# Patient Record
Sex: Female | Born: 1960 | Race: White | Hispanic: No | Marital: Married | Smoking: Former smoker
Health system: Southern US, Community
[De-identification: ages and names within clinical notes are randomized; demographics above are authoritative.]

## PROBLEM LIST (undated history)

## (undated) DIAGNOSIS — J302 Other seasonal allergic rhinitis: Secondary | ICD-10-CM

## (undated) DIAGNOSIS — R350 Frequency of micturition: Secondary | ICD-10-CM

## (undated) DIAGNOSIS — C801 Malignant (primary) neoplasm, unspecified: Secondary | ICD-10-CM

## (undated) DIAGNOSIS — N201 Calculus of ureter: Secondary | ICD-10-CM

## (undated) DIAGNOSIS — S92912A Unspecified fracture of left toe(s), initial encounter for closed fracture: Secondary | ICD-10-CM

## (undated) DIAGNOSIS — Z87442 Personal history of urinary calculi: Secondary | ICD-10-CM

## (undated) DIAGNOSIS — N393 Stress incontinence (female) (male): Secondary | ICD-10-CM

## (undated) DIAGNOSIS — R3915 Urgency of urination: Secondary | ICD-10-CM

## (undated) DIAGNOSIS — T4145XA Adverse effect of unspecified anesthetic, initial encounter: Secondary | ICD-10-CM

## (undated) DIAGNOSIS — D509 Iron deficiency anemia, unspecified: Secondary | ICD-10-CM

## (undated) DIAGNOSIS — T8859XA Other complications of anesthesia, initial encounter: Secondary | ICD-10-CM

## (undated) HISTORY — DX: Malignant (primary) neoplasm, unspecified: C80.1

## (undated) HISTORY — PX: OTHER SURGICAL HISTORY: SHX169

## (undated) HISTORY — PX: ABDOMINAL HYSTERECTOMY: SHX81

## (undated) HISTORY — PX: COLONOSCOPY: SHX174

---

## 1980-10-29 HISTORY — PX: OTHER SURGICAL HISTORY: SHX169

## 1983-10-30 HISTORY — PX: OTHER SURGICAL HISTORY: SHX169

## 1998-07-15 ENCOUNTER — Ambulatory Visit (HOSPITAL_BASED_OUTPATIENT_CLINIC_OR_DEPARTMENT_OTHER): Admission: RE | Admit: 1998-07-15 | Discharge: 1998-07-15 | Payer: Self-pay | Admitting: Surgery

## 1999-02-20 ENCOUNTER — Emergency Department (HOSPITAL_COMMUNITY): Admission: EM | Admit: 1999-02-20 | Discharge: 1999-02-20 | Payer: Self-pay | Admitting: Emergency Medicine

## 1999-02-20 ENCOUNTER — Encounter: Payer: Self-pay | Admitting: Emergency Medicine

## 2001-10-29 HISTORY — PX: TUBAL LIGATION: SHX77

## 2001-10-29 HISTORY — PX: ECTOPIC PREGNANCY SURGERY: SHX613

## 2002-07-28 ENCOUNTER — Encounter: Payer: Self-pay | Admitting: Family Medicine

## 2002-07-28 ENCOUNTER — Encounter: Admission: RE | Admit: 2002-07-28 | Discharge: 2002-07-28 | Payer: Self-pay | Admitting: Family Medicine

## 2006-10-29 HISTORY — PX: OTHER SURGICAL HISTORY: SHX169

## 2007-10-30 HISTORY — PX: OTHER SURGICAL HISTORY: SHX169

## 2009-01-14 ENCOUNTER — Ambulatory Visit (HOSPITAL_COMMUNITY): Admission: RE | Admit: 2009-01-14 | Discharge: 2009-01-14 | Payer: Self-pay | Admitting: Obstetrics and Gynecology

## 2009-01-14 ENCOUNTER — Encounter (INDEPENDENT_AMBULATORY_CARE_PROVIDER_SITE_OTHER): Payer: Self-pay | Admitting: Obstetrics and Gynecology

## 2009-04-22 ENCOUNTER — Ambulatory Visit: Payer: Self-pay | Admitting: Internal Medicine

## 2009-05-03 LAB — CBC & DIFF AND RETIC
Basophils Absolute: 0.1 10*3/uL (ref 0.0–0.1)
EOS%: 2.3 % (ref 0.0–7.0)
Eosinophils Absolute: 0.2 10*3/uL (ref 0.0–0.5)
HGB: 10.1 g/dL — ABNORMAL LOW (ref 11.6–15.9)
Immature Retic Fract: 11.7 % — ABNORMAL HIGH (ref 0.00–10.70)
MCV: 67.6 fL — ABNORMAL LOW (ref 79.5–101.0)
MONO%: 6.8 % (ref 0.0–14.0)
NEUT#: 3.9 10*3/uL (ref 1.5–6.5)
RBC: 4.85 10*6/uL (ref 3.70–5.45)
RDW: 15.8 % — ABNORMAL HIGH (ref 11.2–14.5)
Retic %: 1.03 % (ref 0.50–1.50)
Retic Ct Abs: 49.96 10*3/uL (ref 18.30–72.70)
WBC: 6.6 10*3/uL (ref 3.9–10.3)
lymph#: 2 10*3/uL (ref 0.9–3.3)

## 2009-05-05 LAB — COMPREHENSIVE METABOLIC PANEL
ALT: 13 U/L (ref 0–35)
Albumin: 4 g/dL (ref 3.5–5.2)
CO2: 19 mEq/L (ref 19–32)
Calcium: 8.8 mg/dL (ref 8.4–10.5)
Chloride: 107 mEq/L (ref 96–112)
Glucose, Bld: 115 mg/dL — ABNORMAL HIGH (ref 70–99)
Sodium: 137 mEq/L (ref 135–145)
Total Protein: 6.7 g/dL (ref 6.0–8.3)

## 2009-05-05 LAB — LACTATE DEHYDROGENASE: LDH: 146 U/L (ref 94–250)

## 2009-05-05 LAB — PROTEIN ELECTROPHORESIS, SERUM
Albumin ELP: 58.6 % (ref 55.8–66.1)
Alpha-1-Globulin: 4.1 % (ref 2.9–4.9)
Alpha-2-Globulin: 9.6 % (ref 7.1–11.8)

## 2009-05-05 LAB — FOLATE: Folate: 15.6 ng/mL

## 2009-05-05 LAB — VITAMIN B12: Vitamin B-12: 230 pg/mL (ref 211–911)

## 2009-05-05 LAB — IRON AND TIBC: UIBC: 408 ug/dL

## 2009-06-29 ENCOUNTER — Ambulatory Visit: Payer: Self-pay | Admitting: Internal Medicine

## 2009-07-01 LAB — CBC WITH DIFFERENTIAL/PLATELET
BASO%: 0.6 % (ref 0.0–2.0)
Basophils Absolute: 0 10*3/uL (ref 0.0–0.1)
Eosinophils Absolute: 0.1 10*3/uL (ref 0.0–0.5)
HCT: 37.6 % (ref 34.8–46.6)
HGB: 12.4 g/dL (ref 11.6–15.9)
LYMPH%: 38.5 % (ref 14.0–49.7)
MCHC: 32.9 g/dL (ref 31.5–36.0)
MONO#: 0.6 10*3/uL (ref 0.1–0.9)
NEUT#: 2.7 10*3/uL (ref 1.5–6.5)
NEUT%: 47.5 % (ref 38.4–76.8)
Platelets: 321 10*3/uL (ref 145–400)
WBC: 5.7 10*3/uL (ref 3.9–10.3)
lymph#: 2.2 10*3/uL (ref 0.9–3.3)

## 2009-07-01 LAB — IRON AND TIBC
%SAT: 27 % (ref 20–55)
TIBC: 388 ug/dL (ref 250–470)

## 2009-07-01 LAB — FERRITIN: Ferritin: 6 ng/mL — ABNORMAL LOW (ref 10–291)

## 2009-10-10 ENCOUNTER — Ambulatory Visit: Payer: Self-pay | Admitting: Internal Medicine

## 2009-10-12 LAB — CBC WITH DIFFERENTIAL/PLATELET
Basophils Absolute: 0.1 10*3/uL (ref 0.0–0.1)
EOS%: 1.5 % (ref 0.0–7.0)
Eosinophils Absolute: 0.1 10*3/uL (ref 0.0–0.5)
HGB: 14.9 g/dL (ref 11.6–15.9)
LYMPH%: 32.6 % (ref 14.0–49.7)
MCH: 28.1 pg (ref 25.1–34.0)
MCV: 83.2 fL (ref 79.5–101.0)
MONO%: 9.2 % (ref 0.0–14.0)
Platelets: 335 10*3/uL (ref 145–400)
RDW: 15.8 % — ABNORMAL HIGH (ref 11.2–14.5)

## 2009-10-12 LAB — IRON AND TIBC: UIBC: 238 ug/dL

## 2009-10-12 LAB — FERRITIN: Ferritin: 26 ng/mL (ref 10–291)

## 2010-06-05 ENCOUNTER — Ambulatory Visit: Payer: Self-pay | Admitting: Sports Medicine

## 2010-06-05 DIAGNOSIS — M79609 Pain in unspecified limb: Secondary | ICD-10-CM

## 2010-06-05 DIAGNOSIS — R269 Unspecified abnormalities of gait and mobility: Secondary | ICD-10-CM

## 2010-06-05 DIAGNOSIS — M19049 Primary osteoarthritis, unspecified hand: Secondary | ICD-10-CM | POA: Insufficient documentation

## 2010-11-30 NOTE — Assessment & Plan Note (Signed)
Summary: NP [facility]NER W/FOOT PAIN/MJD   Vital Signs:  Patient profile:   50 year old female Height:      70 inches Weight:      200 pounds BMI:     28.80 BP sitting:   127 / 82  Vitals Entered By: Lillia Pauls CMA (June 05, 2010 8:40 AM)  History of Present Illness: Laura Wall is a 50 year old female who presents today with a 1 year history of pain at the right great toe MTP. She describes the pain as a sharp pain on the plantar surface of her foot that feels like she has a bruise under the ball of the foot. The pain does not radiate and is exacerbated by stepping on the ground barefoot or in flat shoes. She first had the pain a year ago, and experiences it for a couple of days every few weeks, without noting any association with changing physical activity. She has not noticed any redness or swelling of the joint and has not had similar episodes in any other joint.  Laura Wall also complains of pain at the site of a bone spur in the DIP of her right fifth finger. She describes some days in which she has a loss of range of motion and pain. She has seen a surgeon is considering surgical intervention when the pain and limitation becomes too great.  The patient is currenly beginning to train for a 5k, doing a mixture of running and walking for no more than 1.6 miles. She works in Her[employer] at [employer] and is not on her feet all day at work, but does do a lot of typing.   Allergies (verified): 1)  ! * Cdn 2)  ! Pcn  Past History:  Past Medical History: Osteoarthritis in both hands and right knee Bilateral lateral meniscal tears  Past Surgical History: Surgical removal of skin cancer from dorsal surface of right second toe 05/2007  Physical Exam  General:  Well-developed,well-nourished,in no acute distress; alert,appropriate and cooperative throughout examination Msk:  Feet: Symmetrical feet without transverse arch collapse. Mild tenderness to palpation at the head of the right  first metatarsal. No erythema or edema, non-tender to light touch. Plantar fascia and achilles insertions into calcaneous are non-tender to palpation.  Longitudinal archs in tact with some mild collapse (R>L) while standing.  Callous pattern shows eccentric callous on the right medial great toe and concentric callous on the left.  Full ROM of great toe. Pain elicited with plantarflexion of feet while standing.   Running and walking gait show reliance on great toe for push-off with very little supination.   Impression & Recommendations:  Problem # 1:  FOOT PAIN, RIGHT (ICD-729.5)  The patients pain is located at the right plantar surface of the MTP. Her callous pattern and gait suggest increased pressure on that joint, which is likely causing irritation and pain. Will try bilateral shoe inserts with some arch support on the right side to try to unload some pressure from that joint. If this intevention works well, the patient can return to have her other shoes fitted for arch support.  Orders: Sports Insoles [phone])  Problem # 2:  ABNORMALITY OF GAIT (ICD-781.2)  This patient's gait show's predominant push-off from the area of the great toe, which is likely contributing to her pain. The shoe inserts with arch support on the right side, should help to relieve some of the excess stress she applies to that region of the  foot.  Orders: Sports Insoles [phone])  Problem # 3:  OSTEOARTHRITIS, HANDS, BILATERAL (ICD-715.94) The patient has a heberden's node that is quite prominent at the right hand's fifth DIP. She needs to use her hands daily at work, but also is not likely to benefit greatly from surgery. We will try to help her symptoms by using a Ketoprophen gel, which should get good penetration into the joint capsule and will hopefully help her to maintain her hand function at work.  Complete Medication List: 1)  Topical Ketoprofen Gel 20%  .... Use two times a day to  qid Prescriptions: TOPICAL KETOPROFEN GEL 20% USE two times a day TO QID  #60GRM TUBE x PRN   Entered by:   Lillia Pauls CMA   Authorized by:   Enid Baas MD   Signed by:   Lillia Pauls CMA on 06/05/2010   Method used:   Print then Give to Patient   RxID:   [id number]

## 2011-02-08 LAB — BASIC METABOLIC PANEL
BUN: 6 mg/dL (ref 6–23)
CO2: 23 mEq/L (ref 19–32)
Calcium: 9.1 mg/dL (ref 8.4–10.5)
Chloride: 110 mEq/L (ref 96–112)
Creatinine, Ser: 0.87 mg/dL (ref 0.4–1.2)
GFR calc Af Amer: >60 mL/min (ref >60)
GFR calc non Af Amer: >60 mL/min (ref >60)
Glucose, Bld: 112 mg/dL — ABNORMAL HIGH (ref 70–99)
Potassium: 4.2 mEq/L (ref 3.5–5.1)
Sodium: 142 mEq/L (ref 135–145)

## 2011-02-08 LAB — CBC
HCT: 33.1 % — ABNORMAL LOW (ref 36.0–46.0)
Hemoglobin: 10.4 g/dL — ABNORMAL LOW (ref 12.0–15.0)
MCHC: 31.3 g/dL (ref 30.0–36.0)
MCV: 68.8 fL — ABNORMAL LOW (ref 78.0–100.0)
Platelets: 347 10*3/uL (ref 150–400)
RBC: 4.82 MIL/uL (ref 3.87–5.11)
RDW: 17.3 % — ABNORMAL HIGH (ref 11.5–15.5)
WBC: 5.7 10*3/uL (ref 4.0–10.5)

## 2011-02-08 LAB — PREGNANCY, URINE: Preg Test, Ur: NEGATIVE

## 2011-02-08 LAB — TYPE AND SCREEN
ABO/RH(D): O POS
Antibody Screen: NEGATIVE
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
## 2011-03-13 NOTE — Op Note (Signed)
NAME:  Laura Wall, Laura Wall                ACCOUNT NO.:  [id number]   MEDICAL RECORD NO.:  [id number]          PATIENT TYPE:  AMB   LOCATION:  DAY                          [facility]ACILITY:  [facility]   PHYSICIAN:  Crist Fat. Rivard, M.D. DATE OF BIRTH:  24-Sep-1961   DATE OF PROCEDURE:  01/14/2009  DATE OF DISCHARGE:                               OPERATIVE REPORT   PREOPERATIVE DIAGNOSES:  Chronic pelvic pain, uterine fibroids,  endometriosis, anemia, and post NovaSure syndrome.   POSTOPERATIVE DIAGNOSES:  Chronic pelvic pain, uterine fibroids,  endometriosis, anemia, and post NovaSure syndrome with left ovarian  cyst.   ANESTHESIA:  General Dr. Jean Rosenthal.   PROCEDURE:  Robotic-assisted total hysterectomy with left ovarian  cystectomy.   SURGEON:  Dr. Dois Davenport Rivard   ASSISTANT:  Elmira J. Zenia Resides BLOOD LOSS:  Minimal.   PROCEDURE:  After being informed of the planned procedure with possible  complications, including bleeding, infection, injury to bowels, bladder  or ureter, need for laparotomy, expected recovery time and postoperative  transient facial edema, informed consent was obtained.  The patient is  taken to OR #10, given general anesthesia with endotracheal intubation  without any complication.  She is placed in a lithotomy position on a  sticky mattress, arms padded and tucked on each side, knee-high  sequential compressive devices, chest taped to the table.   She is prepped and draped in a sterile fashion.  Pelvic exam reveals an  anteverted uterus with a dominant posterior fibroid for a total uterine  size of approximately 14 weeks.  The uterus was very mobile and both  adnexa are felt and felt to be normal.   A weighted speculum is inserted.  Anterior lip of the cervix is grasped  with a tenaculum forceps and the uterus is sounded at 8 cm.  The cervix  is easily dilated using Hegar dilator until #30, which allows easy  placement of an 8-mm intrauterine Rumi  manipulator.  Its balloon is  inflated with 5 mL of saline.  This is mounted with a vaginal occluder  and a 4.0 KOH ring.  The KOH ring is sutured to the cervix with simple  sutures of 0 Vicryl.  Weighted speculum is removed and a Foley catheter  is inserted into the bladder.   We measured 12 cm above the estimated fundus of the uterus which arrives  approximately 3 cm above the umbilicus.  That area is infiltrated with  Marcaine 0.25 and we perform a vertical 10-mm incision.  That incision  is brought down bluntly to the fascia and the fascia is grasped with  Kocher forceps.  The fascia is incised with Mayo scissors and peritoneum  is entered bluntly.   A pursestring suture of 0 Vicryl is placed on the fascia which will hold  in place a 10-mm Hasson trocar, easily inserted in the abdominal cavity.  This allows for easy insufflation of a pneumoperitoneum, using CO2 at a  maximum pressure of 15 mmHg.  A quick evaluation of the abdominopelvic  cavity reveals a uterus with absence of tubes, a normal right  ovary, a  left ovary with a 3.5 cm simple clear cyst.  The uterus has a dominant  left lower ureter and posterior fibroid, measuring approximately 6 cm.  Nevertheless, it is easily mobile and the posterior cul-de-sac is  normal.  We do note a little bit of retraction on the right pelvic wall,  compatible with previous endometriosis.  The appendix is visualized and  normal.  Liver is visualized and normal.  Gallbladder is not visualized.   Placement of trocars is then decided and we place two 8-mm robotic  trocars on the left after infiltrating with Marcaine 0.25, one 8-mm  trocar on the right after infiltrating with Marcaine 0.25 and a 10-mm  patient-side assistant trocar on the right after infiltrating with  Marcaine 0.25.  The robot is then side-docked and preparation and  docking is complete in 40 minutes.   In arm #1, a monopolar scissor is placed.  Arm #2 PK Gyrus forceps and  in  arm #3 a Cobra forceps.  Console time starts at 9 o'clock.   We proceed with the left side initially by cauterizing the utero-ovarian  ligament and sectioning it, cauterizing the round ligament and  sectioning it.  This gives [place] easy entry into the broad ligament and we  start with the anterior sheath which is brought down anteriorly over the  KOH ring, which is easily identified.  The bladder is bluntly dissected  away from the KOH ring, but this is done with some difficulty, possibly  from endometriotic adhesions.  To clearly identify the limits of the  bladder, we fill it with 200 mL of saline and this really helps [place]  dissect the bladder away from the anterior vaginal fornix bluntly and  sharply.  The posterior sheath of the left broad ligament is then  dissected down, keeping the left ureter under direct visualization.  The  left ureter is deep in the pelvis and clearly away from our site of  surgery.  Proceeding systematically we are able to skeletonize the  uterine artery on that side and we can cauterize it with PK Gyrus  forceps at the level of the KOH ring on its ascending branch.   We then move to the right side.  The utero-ovarian ligament is  cauterized with PK forceps in section.  The round ligament is cauterized  in section.  This gives [place] easy entry into the broad ligament and we  open the anterior sheath to meet the previous opening on the left side,  again all the way to the KOH ring.  We are able to easily dissect that  side of the bladder away from the anterior vaginal fornix, keeping the  KOH ring under direct visualization.  The right ureter is then  identified, easily kept under direct visualization, and we can now bring  down the posterior sheath of the broad ligament to completely  skeletonize the uterine vessels.  The uterine artery is cauterized at  its junction with the KOH ring at its ascending branch.  This is  performed with a  forceps.   Elevating the  uterus completely with the Cobra forceps, we insufflate  the vaginal occluder and, using an open monopolar scissors, we perform a  posterior colpotomy on the KOH ring.  This is brought anteriorly in a  360-degree fashion to completely free the uterus.  The uterus is  delivered vaginally.  Bleeding on the left vaginal artery is cauterized  with PK forceps rapidly.   The  vaginal occluder is reinserted in the vagina to maintain our  pneumoperitoneum and we put our attention on the left ovary.  Using hot  scissors, we open the ovarian cyst on a 1.5-cm distance and, using PK  forceps and Maryland forceps, we easily peel off the cyst wall from the  ovarian capsule.  We then cauterize to achieve a complete hemostasis.  We then profusely irrigate that cyst and still note a satisfactory  hemostasis.   Instruments are then modified for a suture cut in arm #1, a needle  holder in arm #2 and a PK Gyrus forceps in arm #3.  With the PK forceps,  we support the bladder anteriorly and we now proceed with closure of the  vaginal cuff, using figure-of-eight stitches of 0Vicryl.  We then  irrigate profusely with warm saline and complete hemostasis on the  bladder dissection using PK forceps.  We then again irrigate profusely  with warm saline and we note a satisfactory hemostasis.  The  pneumoperitoneum is removed and again hemostasis is satisfactory.   A full sheet of Intercede is deposited on the vaginal fornix.  All  instruments are then removed and the robot is undocked.  Console time  was 1 hour and 50 minutes.   The pneumoperitoneum is evacuated and all trocars are removed under  direct visualization.  The fascia of the supraumbilical incision is  closed with the previously placed pursestring suture of 0 Vicryl.  The  fascia of the right side 10-mm trocar is closed with a figure-of-eight  stitch of 0 Vicryl.  The skin of all incisions is closed with  subcuticular suture of 3-0 Monocryl and  Dermabond.  The vaginal occluder  is removed and a speculum is inserted to inspect the vaginal fornix,  which is hemostatic.  We do note a little bit of clear oozing from the  warm saline left in the pelvis.   Instrument and sponge count is complete x2.  Estimated blood loss is  minimal.  The procedure is very well tolerated by the patient who is  taken to recovery room in a well and stable condition.   Specimen composed of  uterus and left ovarian cyst wall was weighed at  214 grams and was sent to pathology.      Crist Fat Rivard, M.D.  Electronically Signed     SAR/MEDQ  D:  01/14/2009  T:  01/14/2009  Job:  [id number]

## 2011-03-16 ENCOUNTER — Inpatient Hospital Stay (INDEPENDENT_AMBULATORY_CARE_PROVIDER_SITE_OTHER)
Admission: RE | Admit: 2011-03-16 | Discharge: 2011-03-16 | Disposition: A | Discharge status: Home / Self Care | Payer: 59 | Source: Ambulatory Visit | Attending: Emergency Medicine | Admitting: Emergency Medicine

## 2011-03-16 ENCOUNTER — Ambulatory Visit (INDEPENDENT_AMBULATORY_CARE_PROVIDER_SITE_OTHER): Payer: 59

## 2011-03-16 DIAGNOSIS — S61209A Unspecified open wound of unspecified finger without damage to nail, initial encounter: Secondary | ICD-10-CM

## 2011-03-16 DIAGNOSIS — S6000XA Contusion of unspecified finger without damage to nail, initial encounter: Secondary | ICD-10-CM

## 2012-03-17 ENCOUNTER — Other Ambulatory Visit: Payer: Self-pay

## 2012-03-17 DIAGNOSIS — D219 Benign neoplasm of connective and other soft tissue, unspecified: Secondary | ICD-10-CM

## 2012-03-17 DIAGNOSIS — N83209 Unspecified ovarian cyst, unspecified side: Secondary | ICD-10-CM | POA: Insufficient documentation

## 2012-03-17 MED ORDER — FOLIVANE-PLUS PO CAPS: 1.0000 | ORAL_CAPSULE | Freq: Every day | ORAL | Status: DC

## 2012-06-25 ENCOUNTER — Telehealth: Payer: Self-pay

## 2012-06-25 NOTE — Telephone Encounter (Signed)
Pt states she needs refill of Folivane.  States SR has refilled this but only for two.  Will send msg to SR along with chart to office.  ld

## 2012-06-26 ENCOUNTER — Telehealth: Payer: Self-pay | Admitting: Obstetrics and Gynecology

## 2012-06-27 ENCOUNTER — Telehealth: Payer: Self-pay | Admitting: Obstetrics and Gynecology

## 2012-06-27 ENCOUNTER — Other Ambulatory Visit: Payer: Self-pay | Admitting: Obstetrics and Gynecology

## 2012-06-27 MED ORDER — INTEGRA PLUS PO CAPS: 1.0000 | ORAL_CAPSULE | Freq: Every day | ORAL | Status: DC

## 2012-06-27 NOTE — Telephone Encounter (Signed)
Pt wants Maylon Cos and states SR has given before.  Msg and chart sent to SR.  ld

## 2012-06-27 NOTE — Telephone Encounter (Signed)
TC from pt requesting Rf Folivane Plus.  Per Dr SR OK to RF Integra PLus.  Pt to be scheduled for annual. Rx Rf complete.

## 2012-06-27 NOTE — Telephone Encounter (Signed)
Laura/follow up on rx

## 2012-07-01 NOTE — Telephone Encounter (Signed)
This was reviewed with CMA on 06/27/12. Pt to call PCP. No documentation in the chart for previous prescription from this office.

## 2012-07-10 ENCOUNTER — Encounter: Payer: Self-pay | Admitting: Obstetrics and Gynecology

## 2012-07-10 ENCOUNTER — Telehealth: Payer: Self-pay | Admitting: Obstetrics and Gynecology

## 2012-07-10 ENCOUNTER — Ambulatory Visit (INDEPENDENT_AMBULATORY_CARE_PROVIDER_SITE_OTHER): Payer: 59 | Admitting: Obstetrics and Gynecology

## 2012-07-10 VITALS — BP 110/80 | HR 76 | Ht 70.0 in | Wt 184.0 lb

## 2012-07-10 DIAGNOSIS — Z01419 Encounter for gynecological examination (general) (routine) without abnormal findings: Secondary | ICD-10-CM

## 2012-07-10 DIAGNOSIS — R300 Dysuria: Secondary | ICD-10-CM

## 2012-07-10 DIAGNOSIS — D219 Benign neoplasm of connective and other soft tissue, unspecified: Secondary | ICD-10-CM | POA: Insufficient documentation

## 2012-07-10 DIAGNOSIS — R42 Dizziness and giddiness: Secondary | ICD-10-CM | POA: Insufficient documentation

## 2012-07-10 LAB — POCT URINALYSIS DIPSTICK
Blood, UA: NEGATIVE
Nitrite, UA: NEGATIVE
Protein, UA: NEGATIVE
Urobilinogen, UA: NEGATIVE
pH, UA: 5

## 2012-07-10 MED ORDER — NYSTATIN-TRIAMCINOLONE 100000-0.1 UNIT/GM-% EX OINT: TOPICAL_OINTMENT | Freq: Three times a day (TID) | CUTANEOUS | Status: DC | PRN

## 2012-07-10 NOTE — Progress Notes (Signed)
The patient is not taking hormone replacement therapy The patient  is taking a Calcium supplement. Post-menopausal bleeding:no  Last Pap: was normal March  2011 Last mammogram: was normal November  2012 Last DEXA scan : n/a Last colonoscopy:normal, benign polyps removed October 2008  Urinary symptoms: burning Normal bowel movements: Yes Reports abuse at home: No:   Subjective:    Laura Wall is a 51 y.o. female G2P1 who presents for annual exam. S/p robotic hysterectomy 2010 The patient has no complaints today.   The following portions of the patient's history were reviewed and updated as appropriate: allergies, current medications, past family history, past medical history, past social history, past surgical history and problem list.  Review of Systems Pertinent items are noted in HPI. Gastrointestinal:No change in bowel habits, no abdominal pain, no rectal bleeding Genitourinary:negative for dysuria, frequency, hematuria, nocturia and urinary incontinence Occasional burning with urination   Objective:     BP 110/80  Pulse 76  Ht 5\' 10"  (1.778 m)  Wt 184 lb (83.462 kg)  BMI 26.40 kg/m2  Weight:  Wt Readings from Last 1 Encounters:  07/10/12 184 lb (83.462 kg)     BMI: Body mass index is 26.40 kg/(m^2). General Appearance: Alert, appropriate appearance for age. No acute distress HEENT: Grossly normal Neck / Thyroid: Supple, no masses, nodes or enlargement Lungs: clear to auscultation bilaterally Back: No CVA tenderness Breast Exam: No masses or nodes.No dimpling, nipple retraction or discharge. Cardiovascular: Regular rate and rhythm. S1, S2, no murmur Gastrointestinal: Soft, non-tender, no masses or organomegaly Pelvic Exam: Mild erythema on vulva.Vagina normal. Ovaries normal. uterus absent Rectovaginal: normal rectal, no masses Lymphatic Exam: Non-palpable nodes in neck, clavicular, axillary, or inguinal regions Skin: no rash or abnormalities Neurologic: Normal  gait and speech, no tremor  Psychiatric: Alert and oriented, appropriate affect.   U/A: negative   Assessment:    Normal gyn exam  Vulvar non-specific vulvar dermatitis   Plan:   mammogram return annually or prn Follow-up:  for annual exam Mycolog II

## 2012-07-10 NOTE — Telephone Encounter (Signed)
LAURA/PHARM CLAR.

## 2012-07-11 ENCOUNTER — Telehealth: Payer: Self-pay

## 2012-07-11 NOTE — Telephone Encounter (Signed)
PC from [facility] pharmacy. Mycolog rx was going to be $100 copay.  Pharmacist asked if they could split meds per pt request for $4 copay each.  Pt will combine.  Gave permission to do so.  ld

## 2012-07-28 ENCOUNTER — Telehealth: Payer: Self-pay | Admitting: Obstetrics and Gynecology

## 2012-07-28 NOTE — Telephone Encounter (Signed)
Fax refill request sent in [place] with 11 refills.  Pt notified.  ld

## 2012-07-28 NOTE — Telephone Encounter (Signed)
VM from pt. Requesting RF for Integra.  [facility].   Pt [phone]

## 2012-07-28 NOTE — Telephone Encounter (Signed)
sr pt 

## 2012-08-14 ENCOUNTER — Other Ambulatory Visit: Payer: Self-pay | Admitting: Urology

## 2012-08-26 ENCOUNTER — Encounter (HOSPITAL_BASED_OUTPATIENT_CLINIC_OR_DEPARTMENT_OTHER): Payer: Self-pay | Admitting: *Deleted

## 2012-08-26 NOTE — Progress Notes (Addendum)
NPO AFTER MN. ARRIVES AT 1030. NEEDS HG.  THE PT STATES THE STONE IS EMBEDDED IN THE URETHRAL SLING, THAT'S WHY THERE IS NO LEFT OR RIGHT ON PERMIT.

## 2012-08-29 ENCOUNTER — Encounter (HOSPITAL_BASED_OUTPATIENT_CLINIC_OR_DEPARTMENT_OTHER): Payer: Self-pay | Admitting: Anesthesiology

## 2012-08-29 ENCOUNTER — Ambulatory Visit (HOSPITAL_BASED_OUTPATIENT_CLINIC_OR_DEPARTMENT_OTHER): Payer: 59 | Admitting: Anesthesiology

## 2012-08-29 ENCOUNTER — Ambulatory Visit (HOSPITAL_BASED_OUTPATIENT_CLINIC_OR_DEPARTMENT_OTHER)
Admission: RE | Admit: 2012-08-29 | Discharge: 2012-08-29 | Disposition: A | Discharge status: Home / Self Care | Payer: 59 | Source: Ambulatory Visit | Attending: Urology | Admitting: Urology

## 2012-08-29 ENCOUNTER — Encounter (HOSPITAL_BASED_OUTPATIENT_CLINIC_OR_DEPARTMENT_OTHER): Payer: Self-pay | Admitting: *Deleted

## 2012-08-29 ENCOUNTER — Encounter (HOSPITAL_BASED_OUTPATIENT_CLINIC_OR_DEPARTMENT_OTHER)
Admission: RE | Disposition: A | Discharge status: Home / Self Care | Payer: Self-pay | Source: Ambulatory Visit | Attending: Urology

## 2012-08-29 DIAGNOSIS — N211 Calculus in urethra: Secondary | ICD-10-CM | POA: Insufficient documentation

## 2012-08-29 DIAGNOSIS — Z90710 Acquired absence of both cervix and uterus: Secondary | ICD-10-CM | POA: Insufficient documentation

## 2012-08-29 DIAGNOSIS — Y831 Surgical operation with implant of artificial internal device as the cause of abnormal reaction of the patient, or of later complication, without mention of misadventure at the time of the procedure: Secondary | ICD-10-CM | POA: Insufficient documentation

## 2012-08-29 DIAGNOSIS — T8389XA Other specified complication of genitourinary prosthetic devices, implants and grafts, initial encounter: Secondary | ICD-10-CM | POA: Insufficient documentation

## 2012-08-29 HISTORY — PX: HOLMIUM LASER APPLICATION: SHX5852

## 2012-08-29 HISTORY — DX: Personal history of urinary calculi: Z87.442

## 2012-08-29 HISTORY — DX: Calculus of ureter: N20.1

## 2012-08-29 HISTORY — DX: Other seasonal allergic rhinitis: J30.2

## 2012-08-29 HISTORY — DX: Urgency of urination: R39.15

## 2012-08-29 HISTORY — DX: Stress incontinence (female) (male): N39.3

## 2012-08-29 HISTORY — DX: Iron deficiency anemia, unspecified: D50.9

## 2012-08-29 HISTORY — PX: CYSTOSCOPY: SHX5120

## 2012-08-29 HISTORY — DX: Frequency of micturition: R35.0

## 2012-08-29 SURGERY — CYSTOSCOPY
Anesthesia: General | Site: Urethra | Wound class: Clean Contaminated

## 2012-08-29 MED ORDER — PROMETHAZINE HCL 25 MG/ML IJ SOLN
6.2500 mg | INTRAMUSCULAR | Status: DC | PRN
  Filled 2012-08-29: qty 1

## 2012-08-29 MED ORDER — ACETAMINOPHEN 10 MG/ML IV SOLN
INTRAVENOUS | Status: DC | PRN
  Administered 2012-08-29: 1000 mg via INTRAVENOUS

## 2012-08-29 MED ORDER — LACTATED RINGERS IV SOLN
INTRAVENOUS | Status: DC
  Administered 2012-08-29: 11:00:00 via INTRAVENOUS
  Filled 2012-08-29: qty 1000

## 2012-08-29 MED ORDER — PROPOFOL 10 MG/ML IV BOLUS
INTRAVENOUS | Status: DC | PRN
  Administered 2012-08-29: 200 mg via INTRAVENOUS

## 2012-08-29 MED ORDER — FENTANYL CITRATE 0.05 MG/ML IJ SOLN
25.0000 ug | INTRAMUSCULAR | Status: DC | PRN
  Filled 2012-08-29: qty 1

## 2012-08-29 MED ORDER — IOHEXOL 350 MG/ML SOLN
INTRAVENOUS | Status: DC | PRN
  Administered 2012-08-29: 1 mL

## 2012-08-29 MED ORDER — DEXAMETHASONE SODIUM PHOSPHATE 4 MG/ML IJ SOLN
INTRAMUSCULAR | Status: DC | PRN
  Administered 2012-08-29: 10 mg via INTRAVENOUS

## 2012-08-29 MED ORDER — KETOROLAC TROMETHAMINE 30 MG/ML IJ SOLN
15.0000 mg | Freq: Once | INTRAMUSCULAR | Status: DC | PRN
  Filled 2012-08-29: qty 1

## 2012-08-29 MED ORDER — LIDOCAINE HCL (CARDIAC) 20 MG/ML IV SOLN
INTRAVENOUS | Status: DC | PRN
  Administered 2012-08-29: 80 mg via INTRAVENOUS

## 2012-08-29 MED ORDER — TRAMADOL HCL 50 MG PO TABS
50.0000 mg | ORAL_TABLET | Freq: Four times a day (QID) | ORAL | Status: DC | PRN
  Administered 2012-08-29: 50 mg via ORAL
  Filled 2012-08-29: qty 1

## 2012-08-29 MED ORDER — MIDAZOLAM HCL 5 MG/5ML IJ SOLN
INTRAMUSCULAR | Status: DC | PRN
  Administered 2012-08-29: 2 mg via INTRAVENOUS

## 2012-08-29 MED ORDER — SODIUM CHLORIDE 0.9 % IR SOLN
Status: DC | PRN
  Administered 2012-08-29: 6000 mL

## 2012-08-29 MED ORDER — TRAMADOL HCL 50 MG PO TABS: 50.0000 mg | ORAL_TABLET | Freq: Four times a day (QID) | ORAL | Status: DC | PRN

## 2012-08-29 MED ORDER — ONDANSETRON HCL 4 MG/2ML IJ SOLN
INTRAMUSCULAR | Status: DC | PRN
  Administered 2012-08-29: 4 mg via INTRAVENOUS

## 2012-08-29 MED ORDER — FENTANYL CITRATE 0.05 MG/ML IJ SOLN
INTRAMUSCULAR | Status: DC | PRN
  Administered 2012-08-29 (×2): 50 ug via INTRAVENOUS

## 2012-08-29 MED ORDER — KETOROLAC TROMETHAMINE 30 MG/ML IJ SOLN
INTRAMUSCULAR | Status: DC | PRN
  Administered 2012-08-29: 30 mg via INTRAVENOUS

## 2012-08-29 MED ORDER — GENTAMICIN IN SALINE 1.6-0.9 MG/ML-% IV SOLN
80.0000 mg | INTRAVENOUS | Status: AC
  Administered 2012-08-29: 80 mg via INTRAVENOUS
  Filled 2012-08-29: qty 50

## 2012-08-29 MED ORDER — STERILE WATER FOR IRRIGATION IR SOLN
Status: DC | PRN
  Administered 2012-08-29: 500 mL

## 2012-08-29 MED ORDER — SENNA-DOCUSATE SODIUM 8.6-50 MG PO TABS: 1.0000 | ORAL_TABLET | Freq: Two times a day (BID) | ORAL | Status: DC

## 2012-08-29 SURGICAL SUPPLY — 37 items
ADAPTER CATH URET PLST 4-6FR (CATHETERS) ×3 IMPLANT
BAG DRAIN URO-CYSTO SKYTR STRL (DRAIN) ×3 IMPLANT
BAG URO CATCHER STRL LF (DRAPE) ×3 IMPLANT
BASKET LASER NITINOL 1.9FR (BASKET) IMPLANT
BASKET STNLS GEMINI 4WIRE 3FR (BASKET) IMPLANT
BASKET ZERO TIP NITINOL 2.4FR (BASKET) IMPLANT
BRUSH URET BIOPSY 3F (UROLOGICAL SUPPLIES) IMPLANT
CANISTER SUCT LVC 12 LTR MEDI- (MISCELLANEOUS) ×3 IMPLANT
CATH INTERMIT  6FR 70CM (CATHETERS) ×3 IMPLANT
CATH URET 5FR 28IN CONE TIP (BALLOONS)
CATH URET 5FR 28IN OPEN ENDED (CATHETERS) IMPLANT
CATH URET 5FR 70CM CONE TIP (BALLOONS) IMPLANT
CLOTH BEACON ORANGE TIMEOUT ST (SAFETY) ×3 IMPLANT
DRAPE CAMERA CLOSED 9X96 (DRAPES) ×3 IMPLANT
ELECT REM PT RETURN 9FT ADLT (ELECTROSURGICAL)
ELECTRODE REM PT RTRN 9FT ADLT (ELECTROSURGICAL) IMPLANT
GLOVE BIO SURGEON STRL SZ7.5 (GLOVE) ×3 IMPLANT
GLOVE BIOGEL PI IND STRL 7.0 (GLOVE) ×6 IMPLANT
GLOVE BIOGEL PI INDICATOR 7.0 (GLOVE) ×3
GOWN PREVENTION PLUS LG XLONG (DISPOSABLE) ×3 IMPLANT
GOWN STRL NON-REIN LRG LVL3 (GOWN DISPOSABLE) ×6 IMPLANT
GOWN STRL REIN XL XLG (GOWN DISPOSABLE) ×3 IMPLANT
GUIDEWIRE 0.038 PTFE COATED (WIRE) IMPLANT
GUIDEWIRE ANG ZIPWIRE 038X150 (WIRE) IMPLANT
GUIDEWIRE STR DUAL SENSOR (WIRE) ×3 IMPLANT
IV NS IRRIG 3000ML ARTHROMATIC (IV SOLUTION) ×6 IMPLANT
KIT BALLIN UROMAX 15FX10 (LABEL) IMPLANT
KIT BALLN UROMAX 15FX4 (MISCELLANEOUS) IMPLANT
KIT BALLN UROMAX 26 75X4 (MISCELLANEOUS)
LASER FIBER DISP (UROLOGICAL SUPPLIES) ×3 IMPLANT
PACK CYSTOSCOPY (CUSTOM PROCEDURE TRAY) ×3 IMPLANT
SET HIGH PRES BAL DIL (LABEL)
SHEATH URET ACCESS 12FR/35CM (UROLOGICAL SUPPLIES) IMPLANT
SHEATH URET ACCESS 12FR/55CM (UROLOGICAL SUPPLIES) IMPLANT
SYRINGE IRR TOOMEY STRL 70CC (SYRINGE) IMPLANT
WATER STERILE IRR 3000ML UROMA (IV SOLUTION) IMPLANT
WATER STERILE IRR 500ML POUR (IV SOLUTION) ×3 IMPLANT

## 2012-08-29 NOTE — Brief Op Note (Signed)
08/29/2012  11:46 AM  PATIENT:  Laura Wall  51 y.o. female  PRE-OPERATIVE DIAGNOSIS:  Urethral Stone  POST-OPERATIVE DIAGNOSIS:  Urethral Stone  PROCEDURE:  Procedure(s) (LRB) with comments: CYSTOSCOPY (N/A) HOLMIUM LASER APPLICATION (N/A) - There is no left or right designated because the stone is embedded in the urethral sling  SURGEON:  Surgeon(s) and Role:    * Sebastian Ache, MD - Primary  PHYSICIAN ASSISTANT:   ASSISTANTS: none   ANESTHESIA:   general  EBL:  Total I/O In: 100 [I.V.:100] Out: -   BLOOD ADMINISTERED:none  DRAINS: none   LOCAL MEDICATIONS USED:  NONE  SPECIMEN:  No Specimen  DISPOSITION OF SPECIMEN:  N/A  COUNTS:  YES  TOURNIQUET:  * No tourniquets in log *  DICTATION: .Other Dictation: Dictation Number [id number]  PLAN OF CARE: Discharge to home after PACU  PATIENT DISPOSITION:  PACU - hemodynamically stable.   Delay start of Pharmacological VTE agent (>24hrs) due to surgical blood loss or risk of bleeding: not applicable

## 2012-08-29 NOTE — Anesthesia Postprocedure Evaluation (Signed)
  Anesthesia Post-op Note  Patient: Laura Wall  Procedure(s) Performed: Procedure(s) (LRB): CYSTOSCOPY (N/A) HOLMIUM LASER APPLICATION (N/A)  Patient Location: PACU  Anesthesia Type: General  Level of Consciousness: awake and alert   Airway and Oxygen Therapy: Patient Spontanous Breathing  Post-op Pain: mild  Post-op Assessment: Post-op Vital signs reviewed, Patient's Cardiovascular Status Stable, Respiratory Function Stable, Patent Airway and No signs of Nausea or vomiting  Post-op Vital Signs: stable  Complications: No apparent anesthesia complications

## 2012-08-29 NOTE — Progress Notes (Signed)
Dr. Berneice Heinrich called and informed that pt voided. Missed hat, but states she went a good amount.  Ok for d/c to home per Dr. Berneice Heinrich

## 2012-08-29 NOTE — Anesthesia Procedure Notes (Signed)
Procedure Name: LMA Insertion Date/Time: 08/29/2012 11:16 AM Performed by: Maris Berger T Pre-anesthesia Checklist: Patient identified, Emergency Drugs available, Suction available and Patient being monitored Patient Re-evaluated:Patient Re-evaluated prior to inductionOxygen Delivery Method: Circle System Utilized Preoxygenation: Pre-oxygenation with 100% oxygen Intubation Type: IV induction Ventilation: Mask ventilation without difficulty LMA: LMA inserted LMA Size: 4.0 Number of attempts: 1 Placement Confirmation: positive ETCO2 Dental Injury: Teeth and Oropharynx as per pre-operative assessment  Comments: Gauze roll between teeth

## 2012-08-29 NOTE — H&P (Signed)
Laura Wall is an 51 y.o. female.   Chief Complaint: Pre-Op Urethral Stone / Extruded sling Laser Ablation HPI:   1 - Urethral Stone / Dysuria / History Of Urethral Sling - pt with small urethral stone and h/o mid urethral sling found on w/u for recurrent dysuria by CT and office cysto. Stone small, but not amenable to office removal and high suspicion may be on tiny area of eroded sling. Recent UCX negative x2.  NO CV disease or anticoagulant use.  Past Medical History  Diagnosis Date  . Seasonal allergies   . History of kidney stones   . Ureteral stone pt states stone embedded in urethral sling  . Frequency of urination   . Urgency of urination   . SUI (stress urinary incontinence, female)   . Iron deficiency anemia     Past Surgical History  Procedure Date  . Ectopic pregnancy surgery 2003    BILATERAL FIMBRIECTOMIES (TUBAL RUPTURE)  . Robotic-assisted total hysterecotmy w/ left ovarian cystectomy 01-14-2009  DR RIVARD    CHRONIC PELVIC PAIN/ UTERINE FIBROIDS/ ENDOMETRIOSIS/ ANEMIA  . Hysteroscopy w/  novasure endometrial ablation 2008    AND URETHRAL SLING PLACEMENT  . Cold knife cervical conization 1982  . Benign left breast lumpectomy 1985  . Excision basal cell carcinoma paranasal 2007  . Tubal ligation 2003    BILATERAL  . Excision melanoma lesion right toe 2009    Family History  Problem Relation Age of Onset  . Cancer Mother     multiple myeloma  . Cancer Father 85    lung  . Breast cancer Paternal Aunt   . Breast cancer Paternal Aunt   . Breast cancer Paternal Aunt    Social History:  reports that she quit smoking about 20 years ago. Her smoking use included Cigarettes. She has a 30 pack-year smoking history. She has never used smokeless tobacco. She reports that she drinks about 1.2 ounces of alcohol per week. She reports that she does not use illicit drugs.  Allergies:  Allergies  Allergen Reactions  . Codeine Nausea And Vomiting  . Latex Rash  .  Penicillins Rash  . Sulfa Antibiotics Rash    severe    No prescriptions prior to admission    No results found for this or any previous visit (from the past 48 hour(s)). No results found.  Review of Systems  Constitutional: Negative.  Negative for fever and chills.  HENT: Negative.   Eyes: Negative.   Respiratory: Negative.   Cardiovascular: Negative.   Gastrointestinal: Negative.   Genitourinary: Positive for dysuria and urgency. Negative for hematuria and flank pain.  Musculoskeletal: Negative.   Skin: Negative.   Neurological: Negative.   Endo/Heme/Allergies: Negative.   Psychiatric/Behavioral: Negative.     Height 5\' 10"  (1.778 m), weight 81.194 kg (179 lb). Physical Exam  Constitutional: She is oriented to person, place, and time. She appears well-developed and well-nourished.  HENT:  Head: Normocephalic and atraumatic.  Eyes: EOM are normal. Pupils are equal, round, and reactive to light.  Neck: Normal range of motion. Neck supple.  Cardiovascular: Normal rate.   Respiratory: Effort normal.  GI: Soft. Bowel sounds are normal.  Genitourinary: Vagina normal.  Musculoskeletal: Normal range of motion.  Neurological: She is alert and oriented to person, place, and time.  Skin: Skin is warm and dry.  Psychiatric: She has a normal mood and affect. Her behavior is normal. Judgment and thought content normal.     Assessment/Plan 1 - Urethral  Stone / Dysuria / History Of Urethral Sling - Proceed with laser ablation of urethral stone and any visible eroded sling material. Risks including bleeding, infection, need for post-op catheter, worsening of incontinence, recurrence of foreign body previously discussed and re-iterated today. Rare risks including DVT, PE, MI, CVA, and mortality also reviewed.  Laura Wall 08/29/2012, 6:40 AM

## 2012-08-29 NOTE — Anesthesia Preprocedure Evaluation (Signed)
Anesthesia Evaluation  Patient identified by MRN, date of birth, ID band Patient awake    Reviewed: Allergy & Precautions, H&P , NPO status , Patient's Chart, lab work & pertinent test results  Airway Mallampati: II TM Distance: <3 FB Neck ROM: Full    Dental No notable dental hx.    Pulmonary former smoker,  breath sounds clear to auscultation  Pulmonary exam normal       Cardiovascular negative cardio ROS  Rhythm:Regular Rate:Normal     Neuro/Psych negative neurological ROS  negative psych ROS   GI/Hepatic negative GI ROS, Neg liver ROS,   Endo/Other  negative endocrine ROS  Renal/GU negative Renal ROS  negative genitourinary   Musculoskeletal negative musculoskeletal ROS (+)   Abdominal   Peds negative pediatric ROS (+)  Hematology negative hematology ROS (+)   Anesthesia Other Findings   Reproductive/Obstetrics negative OB ROS                           Anesthesia Physical Anesthesia Plan  ASA: II  Anesthesia Plan: General   Post-op Pain Management:    Induction: Intravenous  Airway Management Planned: LMA  Additional Equipment:   Intra-op Plan:   Post-operative Plan:   Informed Consent: I have reviewed the patients History and Physical, chart, labs and discussed the procedure including the risks, benefits and alternatives for the proposed anesthesia with the patient or authorized representative who has indicated his/her understanding and acceptance.   Dental advisory given  Plan Discussed with: CRNA and Surgeon  Anesthesia Plan Comments:         Anesthesia Quick Evaluation

## 2012-08-29 NOTE — Transfer of Care (Signed)
Immediate Anesthesia Transfer of Care Note  Patient: Laura Wall  Procedure(s) Performed: Procedure(s) (LRB) with comments: CYSTOSCOPY (N/A) HOLMIUM LASER APPLICATION (N/A) - There is no left or right designated because the stone is embedded in the urethral sling  Patient Location: PACU  Anesthesia Type:General  Level of Consciousness: awake and alert   Airway & Oxygen Therapy: Patient Spontanous Breathing and Patient connected to nasal cannula oxygen  Post-op Assessment: Report given to PACU RN  Post vital signs: Reviewed and stable  Complications: No apparent anesthesia complications

## 2012-09-01 ENCOUNTER — Encounter (HOSPITAL_BASED_OUTPATIENT_CLINIC_OR_DEPARTMENT_OTHER): Payer: Self-pay | Admitting: Urology

## 2012-09-01 LAB — POCT HEMOGLOBIN-HEMACUE: Hemoglobin: 14.5 g/dL (ref 12.0–15.0)

## 2012-09-01 NOTE — Op Note (Signed)
NAME:  Laura Wall, FRIEND NO.:  [id number]  MEDICAL RECORD NO.:  [id number]  LOCATION:                                FACILITY:  [facility]  PHYSICIAN:  Sebastian Ache, MD     DATE OF BIRTH:  December 10, 1960  DATE OF PROCEDURE: DATE OF DISCHARGE:                              OPERATIVE REPORT   DIAGNOSIS:  Urethral stone.  PROCEDURE:  Cystoscopy with laser cystolitholapaxy.  FINDINGS: 1. Very small approximately 1 mm calcification in the proximal     urethra, appearing to be adherent to a single fiber of apparent     eroded sling. 2. No other stigmata of sling erosion within the urinary bladder,     urethra, or vagina. 3. Excellent ablation of calcification and eroded thread.  COMPLICATIONS:  None.  SPECIMENS:  None.  INDICATION:  Laura Wall is a pleasant 51 year old female with history of midurethral sling, who presented to the office with recurrent dysuria, urgency, frequency, and recurrent urinary tract infection, however, her cultures were always negative.  This prompted suspicion of other etiology causing lower urinary tract irritation.  CT stone study was performed, which revealed a small calcification possibly in the level of the urethra and office cystoscopy corroborated this.  It was suspected that this was due to perhaps a migrated sling thread and this is causing her symptoms.  Options were discussed including observation versus open surgical approaches versus endoscopic management with laser lithotripsy with this small calcification and any associated extraneous sling material and she wished to proceed with the latter.  Informed consent was obtained and placed in the medical record.  PROCEDURE IN DETAIL:  The patient being Laura Wall, was verified. Procedure being cystoscopy with laser cystolitholapaxy was confirmed. Procedure was carried out.  Time-out was performed.  Intravenous antibiotics were administered.  General LMA anesthesia was  introduced. The patient was placed into a low lithotomy position.  Sterile field was created by prepping and draping the patient's vagina, introitus, and proximal thighs using iodine x3.  Next, cystourethroscopy was performed using a 22-French rigid cystoscope with 12-degree offset lens. Inspection of the urinary bladder revealed no diverticula, calcifications, papular lesions.  There was no redness and regrowth in the urinary bladder.  Ureteral orifices were in their normal anatomic position with efflux of clear urine bilaterally.  Inspection of the vagina revealed no sling material whatsoever.  Inspection of the bladder neck and proximal urethra revealed the previously noted calcifications that was appeared to be very small, approximately 1 mm, but densely adherent, it appeared again be a single thread of eroded sling.  A 6- [place] open-ended catheter was advanced to stabilize a 200 nanometer laser fiber and holmium laser energy was applied to the stone and single thread using settings of 0.5 joules and 6 Hz.  This completely ablating the stones and the single thread.  It appeared to be that there was flushed mucosa overlying the entire area with complete resolution of visible foreign body in the urinary tract.  The stone was so small that was not sent for specimen; however, pre and post photographs were taken, documenting the efficacy of the procedure.  The bladder was then  emptied per cystoscope.  Procedure was then terminated.  The patient tolerated the procedure well.  There were no immediate periprocedural complications.  The patient was taken to the postanesthesia care unit in stable condition.          ______________________________ Sebastian Ache, MD     TM/MEDQ  D:  08/29/2012  T:  08/30/2012  Job:  [id number]

## 2012-09-03 ENCOUNTER — Encounter (HOSPITAL_BASED_OUTPATIENT_CLINIC_OR_DEPARTMENT_OTHER): Payer: Self-pay

## 2012-09-17 ENCOUNTER — Telehealth: Payer: Self-pay

## 2012-09-17 DIAGNOSIS — Z1211 Encounter for screening for malignant neoplasm of colon: Secondary | ICD-10-CM

## 2012-09-17 DIAGNOSIS — Z139 Encounter for screening, unspecified: Secondary | ICD-10-CM

## 2012-09-17 MED ORDER — PEG-KCL-NACL-NASULF-NA ASC-C 100 G PO SOLR: 1.0000 | Freq: Once | ORAL | Status: DC

## 2012-09-17 NOTE — Telephone Encounter (Signed)
Pt has been scheduled for colon on 09/30/12 1130 am she will come in on 09/22/12 11 am for instructions

## 2012-09-17 NOTE — Telephone Encounter (Signed)
Laura Wall, Can you contact [name] ([employer]) for routine colon cancer screening colonoscopy. She may have had one previously which Id like to see if possible. Also begin to schedule for [facility] colonoscopy. [phone] work phone Give her a few hours prior to calling, thanks

## 2012-09-22 NOTE — Telephone Encounter (Signed)
Pt has been scheduled and instructed for her procedure she will call with any questions or concerns.

## 2012-09-23 ENCOUNTER — Telehealth: Payer: Self-pay | Admitting: Gastroenterology

## 2012-09-23 NOTE — Telephone Encounter (Signed)
Colonoscopy July 2008, Dr. Smitty Knudsen, [facility]; done for IDA; found 'early diverticulosis', otherwise normal.  No polyps or cancers. He recommended she have repeat colonoscopy at 10 years.   Can you call her.  Ask her about her family history.  There is a note from Dr. Tania Ade that says her father "may have had colon cancer" but he still recommended recall at 10 years because no polyps found and the family history was not clear.  Can she clarify her family history.  If her dad DID have colon cancer, then she should keep the scheduled colonoscopy for next month.  IF her dad did not have colon cancer, then she does not need one until July, 2018 for routine cancer screening.

## 2012-09-23 NOTE — Telephone Encounter (Signed)
Pt says her father developed lung cancer that spread to two other locations but she is not sure if it was the colon.  Should she have the colon as planned or recall for 2018?

## 2012-09-23 NOTE — Telephone Encounter (Signed)
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 She is ok to wait until 2018 for routine risk screening for colon cancer.

## 2012-09-23 NOTE — Telephone Encounter (Signed)
Pt aware colon has been cx and a rebate coupon for the prep has been mailed to her home

## 2012-09-30 ENCOUNTER — Other Ambulatory Visit: Payer: 59 | Admitting: Gastroenterology

## 2012-12-22 ENCOUNTER — Other Ambulatory Visit: Payer: Self-pay | Admitting: Dermatology

## 2013-02-03 ENCOUNTER — Other Ambulatory Visit (HOSPITAL_COMMUNITY): Payer: Self-pay | Admitting: Urology

## 2013-02-03 DIAGNOSIS — K7689 Other specified diseases of liver: Secondary | ICD-10-CM

## 2013-03-07 ENCOUNTER — Ambulatory Visit (HOSPITAL_COMMUNITY)
Admission: RE | Admit: 2013-03-07 | Discharge: 2013-03-07 | Disposition: A | Discharge status: Home / Self Care | Payer: 59 | Source: Ambulatory Visit | Attending: Urology | Admitting: Urology

## 2013-03-07 DIAGNOSIS — K7689 Other specified diseases of liver: Secondary | ICD-10-CM | POA: Insufficient documentation

## 2013-03-07 MED ORDER — GADOBENATE DIMEGLUMINE 529 MG/ML IV SOLN
15.0000 mL | Freq: Once | INTRAVENOUS | Status: AC | PRN
  Administered 2013-03-07: 15 mL via INTRAVENOUS

## 2013-05-25 ENCOUNTER — Telehealth: Payer: Self-pay | Admitting: Gastroenterology

## 2013-05-25 NOTE — Telephone Encounter (Signed)
Pt has been scheduled to see Dr Christella Hartigan to discuss colon, she has some minor bright red rectal bleeding with a history of hemorrhoids

## 2013-06-24 ENCOUNTER — Ambulatory Visit (INDEPENDENT_AMBULATORY_CARE_PROVIDER_SITE_OTHER): Payer: 59 | Admitting: Gastroenterology

## 2013-06-24 ENCOUNTER — Encounter: Payer: Self-pay | Admitting: Gastroenterology

## 2013-06-24 VITALS — BP 100/70 | HR 80 | Ht 70.0 in | Wt 180.6 lb

## 2013-06-24 DIAGNOSIS — K625 Hemorrhage of anus and rectum: Secondary | ICD-10-CM

## 2013-06-24 MED ORDER — MOVIPREP 100 G PO SOLR: 1.0000 | Freq: Once | ORAL | Status: DC

## 2013-06-24 NOTE — Progress Notes (Deleted)
HPI: This is a  very pleasant    Review of systems: Pertinent positive and negative review of systems were noted in the above HPI section. Complete review of systems was performed and was otherwise normal.    Past Medical History  Diagnosis Date  . Seasonal allergies   . History of kidney stones   . Ureteral stone pt states stone embedded in urethral sling  . Frequency of urination   . Urgency of urination   . SUI (stress urinary incontinence, female)   . Iron deficiency anemia     Past Surgical History  Procedure Laterality Date  . Ectopic pregnancy surgery  2003    BILATERAL FIMBRIECTOMIES (TUBAL RUPTURE)  . Robotic-assisted total hysterecotmy w/ left ovarian cystectomy  01-14-2009  DR RIVARD    CHRONIC PELVIC PAIN/ UTERINE FIBROIDS/ ENDOMETRIOSIS/ ANEMIA  . Hysteroscopy w/  novasure endometrial ablation  2008    AND URETHRAL SLING PLACEMENT  . Cold knife cervical conization  1982  . Benign left breast lumpectomy  1985  . Excision basal cell carcinoma paranasal  2007  . Tubal ligation  2003    BILATERAL  . Excision melanoma lesion right toe  2009  . Cystoscopy  08/29/2012    Procedure: CYSTOSCOPY;  Surgeon: Sebastian Ache, MD;  Location: [facility];  Service: Urology;  Laterality: N/A;  . Holmium laser application  08/29/2012    Procedure: HOLMIUM LASER APPLICATION;  Surgeon: Sebastian Ache, MD;  Location: [facility];  Service: Urology;  Laterality: N/A;  There is no left or right designated because the stone is embedded in the urethral sling    Current Outpatient Prescriptions  Medication Sig Dispense Refill  . FeFum-FePoly-FA-B Cmp-C-Biot (INTEGRA PLUS) CAPS Take 1 capsule by mouth daily.  30 capsule  0  . traMADol (ULTRAM) 50 MG tablet Take 1 tablet (50 mg total) by mouth every 6 (six) hours as needed for pain.  30 tablet  0   No current facility-administered medications for this visit.    Allergies as of 06/24/2013 - Review Complete  06/24/2013  Allergen Reaction Noted  . Codeine Nausea And Vomiting 07/10/2012  . Latex Rash 08/26/2012  . Penicillins Rash 06/05/2010  . Sulfa antibiotics Rash 08/26/2012    Family History  Problem Relation Age of Onset  . Cancer Mother     multiple myeloma  . Cancer Father 96    lung  . Breast cancer Paternal Aunt   . Breast cancer Paternal Aunt   . Breast cancer Paternal Aunt     History   Social History  . Marital Status: Married    Spouse Name: N/A    Number of Children: N/A  . Years of Education: N/A   Occupational History  . Not on file.   Social History Main Topics  . Smoking status: Former Smoker -- 3.00 packs/day for 10 years    Types: Cigarettes    Quit date: 08/26/1992  . Smokeless tobacco: Never Used  . Alcohol Use: 1.2 oz/week    2 Glasses of wine per week  . Drug Use: No  . Sexual Activity: Not on file   Other Topics Concern  . Not on file   Social History Narrative  . No narrative on file       Physical Exam: BP 100/70  Pulse 80  Ht 5\' 10"  (1.778 m)  Wt 180 lb 9.6 oz (81.92 kg)  BMI 25.91 kg/m2 Constitutional: generally well-appearing Psychiatric: alert and oriented x3 Eyes: extraocular  movements intact Mouth: oral pharynx moist, no lesions Neck: supple no lymphadenopathy Cardiovascular: heart regular rate and rhythm Lungs: clear to auscultation bilaterally Abdomen: soft, nontender, nondistended, no obvious ascites, no peritoneal signs, normal bowel sounds Extremities: no lower extremity edema bilaterally Skin: no lesions on visible extremities    Assessment and plan: 52 y.o. female with  ***  ***

## 2013-06-24 NOTE — Progress Notes (Signed)
HPI: This is a   very pleasant 52 year old Laura Wall whom I am meeting for the first time today.  About once per month, BRBPR.  No pain associated.  No constipation.  No pushing or straining.  No real diarrhea.  No abd pains.  She had Colonoscopy July 2008, Dr. Smitty Knudsen, [facility]; done for IDA; found 'early diverticulosis', otherwise normal. No polyps or cancers. He recommended she have repeat colonoscopy at 10 years  Bleeding for 4-5 months.  Trouble with sedation in 2008, woke up.  Her husband is 28, just recently diagnosed with prostate.   Review of systems: Pertinent positive and negative review of systems were noted in the above HPI section. Complete review of systems was performed and was otherwise normal.    Past Medical History  Diagnosis Date  . Seasonal allergies   . History of kidney stones   . Ureteral stone pt states stone embedded in urethral sling  . Frequency of urination   . Urgency of urination   . SUI (stress urinary incontinence, female)   . Iron deficiency anemia     Past Surgical History  Procedure Laterality Date  . Ectopic pregnancy surgery  2003    BILATERAL FIMBRIECTOMIES (TUBAL RUPTURE)  . Robotic-assisted total hysterecotmy w/ left ovarian cystectomy  01-14-2009  DR RIVARD    CHRONIC PELVIC PAIN/ UTERINE FIBROIDS/ ENDOMETRIOSIS/ ANEMIA  . Hysteroscopy w/  novasure endometrial ablation  2008    AND URETHRAL SLING PLACEMENT  . Cold knife cervical conization  1982  . Benign left breast lumpectomy  1985  . Excision basal cell carcinoma paranasal  2007  . Tubal ligation  2003    BILATERAL  . Excision melanoma lesion right toe  2009  . Cystoscopy  08/29/2012    Procedure: CYSTOSCOPY;  Surgeon: Sebastian Ache, MD;  Location: [facility];  Service: Urology;  Laterality: N/A;  . Holmium laser application  08/29/2012    Procedure: HOLMIUM LASER APPLICATION;  Surgeon: Sebastian Ache, MD;  Location: [facility];  Service:  Urology;  Laterality: N/A;  There is no left or right designated because the stone is embedded in the urethral sling    Current Outpatient Prescriptions  Medication Sig Dispense Refill  . diazepam (VALIUM) 1 MG/ML solution Take by mouth every 8 (eight) hours as needed.      . diazepam (VALIUM) 10 MG tablet Take 10 mg by mouth every 6 (six) hours as needed for anxiety.      . FeFum-FePoly-FA-B Cmp-C-Biot (INTEGRA PLUS) CAPS Take 1 capsule by mouth daily.  30 capsule  0   No current facility-administered medications for this visit.    Allergies as of 06/24/2013 - Review Complete 06/24/2013  Allergen Reaction Noted  . Codeine Nausea And Vomiting 07/10/2012  . Latex Rash 08/26/2012  . Penicillins Rash 06/05/2010  . Sulfa antibiotics Rash 08/26/2012    Family History  Problem Relation Age of Onset  . Cancer Mother     multiple myeloma  . Cancer Father 81    lung  . Breast cancer Paternal Aunt   . Breast cancer Paternal Aunt   . Breast cancer Paternal Aunt     History   Social History  . Marital Status: Married    Spouse Name: N/A    Number of Children: N/A  . Years of Education: N/A   Occupational History  . Not on file.   Social History Main Topics  . Smoking status: Former Smoker -- 3.00 packs/day for  10 years    Types: Cigarettes    Quit date: 08/26/1992  . Smokeless tobacco: Never Used  . Alcohol Use: 1.2 oz/week    2 Glasses of wine per week  . Drug Use: No  . Sexual Activity: Not on file   Other Topics Concern  . Not on file   Social History Narrative  . No narrative on file       Physical Exam: BP 100/70  Pulse 80  Ht 5\' 10"  (1.778 m)  Wt 180 lb 9.6 oz (81.92 kg)  BMI 25.91 kg/m2 Constitutional: generally well-appearing Psychiatric: alert and oriented x3 Eyes: extraocular movements intact Mouth: oral pharynx moist, no lesions Neck: supple no lymphadenopathy Cardiovascular: heart regular rate and rhythm Lungs: clear to auscultation  bilaterally Abdomen: soft, nontender, nondistended, no obvious ascites, no peritoneal signs, normal bowel sounds Extremities: no lower extremity edema bilaterally Skin: no lesions on visible extremities Rectal examination deferred for upcoming colonoscopy   Assessment and plan: 52 y.o. female with  minor rectal bleeding, chronic iron deficiency anemia  She has noticed minor rectal bleeding over the past 4 or 5 months. Last colonoscopy was 6 years ago. We should proceed with repeat colonoscopy at this time to rule out neoplastic causes. This would best be done with MAC sedation given her trouble with moderate sedation the past. I see no reason for any further blood tests or imaging studies prior to then.

## 2013-06-24 NOTE — Patient Instructions (Addendum)
You will be set up for a colonoscopy (LEC, MAC sedation) for minor rectal bleeding.                                               We are excited to introduce MyChart, a new best-in-class service that provides you online access to important information in your electronic medical record. We want to make it easier for you to view your health information - all in one secure location - when and where you need it. We expect MyChart will enhance the quality of care and service we provide.  When you register for MyChart, you can:    View your test results.    Request appointments and receive appointment reminders via email.    Request medication renewals.    View your medical history, allergies, medications and immunizations.    Communicate with your physician's office through a password-protected site.    Conveniently print information such as your medication lists.  To find out if MyChart is right for you, please talk to a member of our clinical staff today. We will gladly answer your questions about this free health and wellness tool.  If you are age 60 or older and want a member of your family to have access to your record, you must provide written consent by completing a proxy form available at our office. Please speak to our clinical staff about guidelines regarding accounts for patients younger than age 29.  As you activate your MyChart account and need any technical assistance, please call the MyChart technical support line at [phone]) or email your question to [email or web address]. If you email your question(s), please include your name, a return phone number and the best time to reach you.  If you have non-urgent health-related questions, you can send a message to our office through MyChart at [email or web address]. If you have a medical emergency, call 911.  Thank you for using MyChart as your new health and wellness resource!   MyChart licensed from [employer],  [id number]. Patents Pending.

## 2013-07-29 ENCOUNTER — Encounter: Payer: Self-pay | Admitting: Gastroenterology

## 2013-07-29 ENCOUNTER — Ambulatory Visit (AMBULATORY_SURGERY_CENTER): Payer: 59 | Admitting: Gastroenterology

## 2013-07-29 VITALS — BP 132/79 | HR 65 | Temp 97.0°F | Resp 14 | Ht 70.0 in | Wt 180.0 lb

## 2013-07-29 DIAGNOSIS — D126 Benign neoplasm of colon, unspecified: Secondary | ICD-10-CM

## 2013-07-29 DIAGNOSIS — D129 Benign neoplasm of anus and anal canal: Secondary | ICD-10-CM

## 2013-07-29 DIAGNOSIS — K625 Hemorrhage of anus and rectum: Secondary | ICD-10-CM

## 2013-07-29 DIAGNOSIS — D128 Benign neoplasm of rectum: Secondary | ICD-10-CM

## 2013-07-29 DIAGNOSIS — K648 Other hemorrhoids: Secondary | ICD-10-CM

## 2013-07-29 MED ORDER — SODIUM CHLORIDE 0.9 % IV SOLN: 500.0000 mL | INTRAVENOUS | Status: DC

## 2013-07-29 NOTE — Progress Notes (Signed)
Procedure ends, to recovery, report given and VSS. 

## 2013-07-29 NOTE — Progress Notes (Signed)
Called to room to assist during endoscopic procedure.  Patient ID and intended procedure confirmed with present staff. Received instructions for my participation in the procedure from the performing physician.  

## 2013-07-29 NOTE — Progress Notes (Signed)
Patient did not experience any of the following events: a burn prior to discharge; a fall within the facility; wrong site/side/patient/procedure/implant event; or a hospital transfer or hospital admission upon discharge from the facility. (G8907) Patient did not have preoperative order for IV antibiotic SSI prophylaxis. (G8918)  

## 2013-07-29 NOTE — Patient Instructions (Signed)
Colon polyp x 1 removed today. Handout given on polyps. Hold aspirin,aspirin containing products, NSAIDS for 2 weeks. May take tylenol if needed. Repeat colonoscopy in 3 years, await pathology. Call us with any questions or concerns. Thank you!!  YOU HAD AN ENDOSCOPIC PROCEDURE TODAY AT THE [facility]: Refer to the procedure report that was given to you for any specific questions about what was found during the examination.  If the procedure report does not answer your questions, please call your gastroenterologist to clarify.  If you requested that your care partner not be given the details of your procedure findings, then the procedure report has been included in a sealed envelope for you to review at your convenience later.  YOU SHOULD EXPECT: Some feelings of bloating in the abdomen. Passage of more gas than usual.  Walking can help get rid of the air that was put into your GI tract during the procedure and reduce the bloating. If you had a lower endoscopy (such as a colonoscopy or flexible sigmoidoscopy) you may notice spotting of blood in your stool or on the toilet paper. If you underwent a bowel prep for your procedure, then you may not have a normal bowel movement for a few days.  DIET: Your first meal following the procedure should be a light meal and then it is ok to progress to your normal diet.  A half-sandwich or bowl of soup is an example of a good first meal.  Heavy or fried foods are harder to digest and may make you feel nauseous or bloated.  Likewise meals heavy in dairy and vegetables can cause extra gas to form and this can also increase the bloating.  Drink plenty of fluids but you should avoid alcoholic beverages for 24 hours.  ACTIVITY: Your care partner should take you home directly after the procedure.  You should plan to take it easy, moving slowly for the rest of the day.  You can resume normal activity the day after the procedure however you should NOT DRIVE or use  heavy machinery for 24 hours (because of the sedation medicines used during the test).    SYMPTOMS TO REPORT IMMEDIATELY: A gastroenterologist can be reached at any hour.  During normal business hours, 8:30 AM to 5:00 PM Monday through Friday, call [phone].  After hours and on weekends, please call the GI answering service at [phone] who will take a message and have the physician on call contact you.   Following lower endoscopy (colonoscopy or flexible sigmoidoscopy):  Excessive amounts of blood in the stool  Significant tenderness or worsening of abdominal pains  Swelling of the abdomen that is new, acute  Fever of 100F or higher  Following upper endoscopy (EGD)  Vomiting of blood or coffee ground material  New chest pain or pain under the shoulder blades  Painful or persistently difficult swallowing  New shortness of breath  Fever of 100F or higher  Black, tarry-looking stools  FOLLOW UP: If any biopsies were taken you will be contacted by phone or by letter within the next 1-3 weeks.  Call your gastroenterologist if you have not heard about the biopsies in 3 weeks.  Our staff will call the home number listed on your records the next business day following your procedure to check on you and address any questions or concerns that you may have at that time regarding the information given to you following your procedure. This is a courtesy call and so if there  is no answer at the home number and we have not heard from you through the emergency physician on call, we will assume that you have returned to your regular daily activities without incident.  SIGNATURES/CONFIDENTIALITY: You and/or your care partner have signed paperwork which will be entered into your electronic medical record.  These signatures attest to the fact that that the information above on your After Visit Summary has been reviewed and is understood.  Full responsibility of the confidentiality of this  discharge information lies with you and/or your care-partner.

## 2013-07-29 NOTE — Op Note (Signed)
[facility] [address] [place], [address]   COLONOSCOPY PROCEDURE REPORT  PATIENT: Laura Wall, Laura Wall  MR#: [id number] BIRTHDATE: October 01, 1961 , 52  yrs. old GENDER: Female ENDOSCOPIST: Rachael Fee, MD PROCEDURE DATE:  07/29/2013 PROCEDURE:   Colonoscopy with snare polypectomy First Screening Colonoscopy - Avg.  risk and is 50 yrs.  old or older - No.  Prior Negative Screening - Now for repeat screening. N/A  History of Adenoma - Now for follow-up colonoscopy & has been > or = to 3 yrs.  N/A  Polyps Removed Today? Yes. ASA CLASS:   Class III INDICATIONS:minor rectal bleeding, colonoscopy in [place] 5-6 years ago without polyps. MEDICATIONS: MAC sedation, administered by CRNA and Propofol (Diprivan) 220 mg IV  DESCRIPTION OF PROCEDURE:   After the risks benefits and alternatives of the procedure were thoroughly explained, informed consent was obtained.  A digital rectal exam revealed no abnormalities of the rectum.   The LB [id number]  endoscope was introduced through the anus and advanced to the cecum, which was identified by both the appendix and ileocecal valve. No adverse events experienced.   The quality of the prep was good.  The instrument was then slowly withdrawn as the colon was fully examined.  COLON FINDINGS: One polyp was found, removed and sent to pathology. This was pedunculated, 1.1cm across, located in proximal rectum, completely removed with snare/cautery.  There were small internal hemorrhoids.  The examination was otherwise normal.  Retroflexed views revealed no abnormalities. The time to cecum=2 minutes 51 seconds.  Withdrawal time=8 minutes 47 seconds.  The scope was withdrawn and the procedure completed. COMPLICATIONS: There were no complications.  ENDOSCOPIC IMPRESSION: One polyp was found, removed and sent to pathology. Given its size, location this polyp MAY have been causing minor rectal bleeding that you have seen. There  were small internal hemorrhoids. The examination was otherwise normal.  RECOMMENDATIONS: If the polyp(s) removed today are proven to be adenomatous (pre-cancerous) polyps, you will need a colonoscopy in 3 years. Otherwise you should continue to follow colorectal cancer screening guidelines for "routine risk" patients with a colonoscopy in 10 years.  You will receive a letter within 1-2 weeks with the results of your biopsy as well as final recommendations.  Please call my office if you have not received a letter after 3 weeks.   eSigned:  Rachael Fee, MD 07/29/2013 10:17 AM

## 2013-07-30 ENCOUNTER — Telehealth: Payer: Self-pay | Admitting: *Deleted

## 2013-07-30 NOTE — Telephone Encounter (Signed)
  Follow up Call-  Call back number 07/29/2013  Post procedure Call Back phone  # [phone]  Permission to leave phone message Yes     Patient questions:  Do you have a fever, pain , or abdominal swelling? no Pain Score  0 *  Have you tolerated food without any problems? yes  Have you been able to return to your normal activities? yes  Do you have any questions about your discharge instructions: Diet   no Medications  no Follow up visit  no  Do you have questions or concerns about your Care? yes  Actions: * If pain score is 4 or above: No action needed, pain <4.

## 2013-08-04 ENCOUNTER — Encounter: Payer: Self-pay | Admitting: Gastroenterology

## 2013-09-03 ENCOUNTER — Other Ambulatory Visit: Payer: Self-pay

## 2013-10-08 ENCOUNTER — Other Ambulatory Visit: Payer: Self-pay | Admitting: Radiology

## 2014-03-04 ENCOUNTER — Other Ambulatory Visit: Payer: Self-pay | Admitting: Dermatology

## 2014-08-30 ENCOUNTER — Encounter: Payer: Self-pay | Admitting: Gastroenterology

## 2014-09-28 HISTORY — PX: BREAST RECONSTRUCTION: SHX9

## 2014-09-30 ENCOUNTER — Other Ambulatory Visit: Payer: Self-pay | Admitting: Plastic Surgery

## 2014-09-30 DIAGNOSIS — N62 Hypertrophy of breast: Secondary | ICD-10-CM

## 2014-10-04 ENCOUNTER — Encounter (HOSPITAL_BASED_OUTPATIENT_CLINIC_OR_DEPARTMENT_OTHER): Payer: Self-pay | Admitting: *Deleted

## 2014-10-04 NOTE — Progress Notes (Signed)
No labs per anesthesia needed 

## 2014-10-07 ENCOUNTER — Encounter (HOSPITAL_BASED_OUTPATIENT_CLINIC_OR_DEPARTMENT_OTHER): Payer: Self-pay | Admitting: *Deleted

## 2014-10-07 ENCOUNTER — Other Ambulatory Visit: Payer: Self-pay | Admitting: Plastic Surgery

## 2014-10-07 ENCOUNTER — Ambulatory Visit (HOSPITAL_BASED_OUTPATIENT_CLINIC_OR_DEPARTMENT_OTHER)
Admission: RE | Admit: 2014-10-07 | Discharge: 2014-10-07 | Disposition: A | Discharge status: Home / Self Care | Payer: Managed Care, Other (non HMO) | Source: Ambulatory Visit | Attending: Plastic Surgery | Admitting: Plastic Surgery

## 2014-10-07 ENCOUNTER — Encounter (HOSPITAL_BASED_OUTPATIENT_CLINIC_OR_DEPARTMENT_OTHER)
Admission: RE | Disposition: A | Discharge status: Home / Self Care | Payer: Self-pay | Source: Ambulatory Visit | Attending: Plastic Surgery

## 2014-10-07 ENCOUNTER — Ambulatory Visit (HOSPITAL_BASED_OUTPATIENT_CLINIC_OR_DEPARTMENT_OTHER): Payer: Managed Care, Other (non HMO) | Admitting: Anesthesiology

## 2014-10-07 DIAGNOSIS — M1990 Unspecified osteoarthritis, unspecified site: Secondary | ICD-10-CM | POA: Diagnosis not present

## 2014-10-07 DIAGNOSIS — N62 Hypertrophy of breast: Secondary | ICD-10-CM

## 2014-10-07 DIAGNOSIS — Z87442 Personal history of urinary calculi: Secondary | ICD-10-CM | POA: Insufficient documentation

## 2014-10-07 DIAGNOSIS — F1010 Alcohol abuse, uncomplicated: Secondary | ICD-10-CM | POA: Insufficient documentation

## 2014-10-07 DIAGNOSIS — Z87891 Personal history of nicotine dependence: Secondary | ICD-10-CM | POA: Insufficient documentation

## 2014-10-07 DIAGNOSIS — Z411 Encounter for cosmetic surgery: Secondary | ICD-10-CM | POA: Diagnosis present

## 2014-10-07 HISTORY — DX: Adverse effect of unspecified anesthetic, initial encounter: T41.45XA

## 2014-10-07 HISTORY — DX: Other complications of anesthesia, initial encounter: T88.59XA

## 2014-10-07 HISTORY — PX: LIPOSUCTION: SHX10

## 2014-10-07 HISTORY — PX: BREAST REDUCTION SURGERY: SHX8

## 2014-10-07 HISTORY — PX: BREAST LUMPECTOMY WITH RADIOACTIVE SEED LOCALIZATION: SHX6424

## 2014-10-07 LAB — POCT HEMOGLOBIN-HEMACUE: Hemoglobin: 14.7 g/dL (ref 12.0–15.0)

## 2014-10-07 SURGERY — MAMMOPLASTY, REDUCTION
Anesthesia: General | Site: Breast | Laterality: Right

## 2014-10-07 MED ORDER — HYDROCODONE-ACETAMINOPHEN 5-325 MG PO TABS: 1.0000 | ORAL_TABLET | Freq: Four times a day (QID) | ORAL | Status: DC | PRN

## 2014-10-07 MED ORDER — SUFENTANIL CITRATE 50 MCG/ML IV SOLN
INTRAVENOUS | Status: DC | PRN
  Administered 2014-10-07: 30 ug via INTRAVENOUS
  Administered 2014-10-07: 20 ug via INTRAVENOUS

## 2014-10-07 MED ORDER — FENTANYL CITRATE 0.05 MG/ML IJ SOLN: 50.0000 ug | INTRAMUSCULAR | Status: DC | PRN

## 2014-10-07 MED ORDER — SUFENTANIL CITRATE 50 MCG/ML IV SOLN
INTRAVENOUS | Status: AC
  Filled 2014-10-07: qty 1

## 2014-10-07 MED ORDER — LACTATED RINGERS IV SOLN
INTRAVENOUS | Status: DC
  Administered 2014-10-07 (×3): via INTRAVENOUS

## 2014-10-07 MED ORDER — EPINEPHRINE HCL 1 MG/ML IJ SOLN
INTRAMUSCULAR | Status: AC
  Filled 2014-10-07: qty 1

## 2014-10-07 MED ORDER — MIDAZOLAM HCL 2 MG/2ML IJ SOLN: 1.0000 mg | INTRAMUSCULAR | Status: DC | PRN

## 2014-10-07 MED ORDER — ONDANSETRON HCL 4 MG/2ML IJ SOLN
INTRAMUSCULAR | Status: DC | PRN
  Administered 2014-10-07: 4 mg via INTRAVENOUS

## 2014-10-07 MED ORDER — DEXAMETHASONE SODIUM PHOSPHATE 4 MG/ML IJ SOLN
INTRAMUSCULAR | Status: DC | PRN
  Administered 2014-10-07: 10 mg via INTRAVENOUS

## 2014-10-07 MED ORDER — PROPOFOL 10 MG/ML IV BOLUS
INTRAVENOUS | Status: DC | PRN
  Administered 2014-10-07: 100 mg via INTRAVENOUS
  Administered 2014-10-07: 200 mg via INTRAVENOUS

## 2014-10-07 MED ORDER — MIDAZOLAM HCL 5 MG/5ML IJ SOLN
INTRAMUSCULAR | Status: DC | PRN
  Administered 2014-10-07: 2 mg via INTRAVENOUS

## 2014-10-07 MED ORDER — SODIUM CHLORIDE 0.9 % IR SOLN
Status: DC | PRN
  Administered 2014-10-07: 500 mL

## 2014-10-07 MED ORDER — HYDROMORPHONE HCL 1 MG/ML IJ SOLN
0.2500 mg | INTRAMUSCULAR | Status: DC | PRN
  Administered 2014-10-07 (×2): 0.5 mg via INTRAVENOUS

## 2014-10-07 MED ORDER — HYDROMORPHONE HCL 1 MG/ML IJ SOLN
INTRAMUSCULAR | Status: AC
  Filled 2014-10-07: qty 1

## 2014-10-07 MED ORDER — OXYCODONE HCL 5 MG PO TABS
5.0000 mg | ORAL_TABLET | Freq: Once | ORAL | Status: AC | PRN
  Administered 2014-10-07: 5 mg via ORAL

## 2014-10-07 MED ORDER — SCOPOLAMINE 1 MG/3DAYS TD PT72
MEDICATED_PATCH | TRANSDERMAL | Status: AC
  Filled 2014-10-07: qty 1

## 2014-10-07 MED ORDER — SUCCINYLCHOLINE CHLORIDE 20 MG/ML IJ SOLN
INTRAMUSCULAR | Status: DC | PRN
  Administered 2014-10-07: 100 mg via INTRAVENOUS

## 2014-10-07 MED ORDER — LIDOCAINE HCL (PF) 1 % IJ SOLN
INTRAMUSCULAR | Status: AC
  Filled 2014-10-07: qty 60

## 2014-10-07 MED ORDER — MIDAZOLAM HCL 2 MG/2ML IJ SOLN
INTRAMUSCULAR | Status: AC
  Filled 2014-10-07: qty 2

## 2014-10-07 MED ORDER — BUPIVACAINE-EPINEPHRINE (PF) 0.25% -1:200000 IJ SOLN
INTRAMUSCULAR | Status: AC
  Filled 2014-10-07: qty 60

## 2014-10-07 MED ORDER — EPHEDRINE SULFATE 50 MG/ML IJ SOLN
INTRAMUSCULAR | Status: DC | PRN
  Administered 2014-10-07: 15 mg via INTRAVENOUS
  Administered 2014-10-07 (×2): 10 mg via INTRAVENOUS

## 2014-10-07 MED ORDER — BUPIVACAINE HCL (PF) 0.25 % IJ SOLN
INTRAMUSCULAR | Status: DC | PRN
  Administered 2014-10-07: 20 mL

## 2014-10-07 MED ORDER — PROPOFOL INFUSION 10 MG/ML OPTIME
INTRAVENOUS | Status: DC | PRN
  Administered 2014-10-07: 12 ug/kg/min via INTRAVENOUS

## 2014-10-07 MED ORDER — CIPROFLOXACIN IN D5W 400 MG/200ML IV SOLN
400.0000 mg | INTRAVENOUS | Status: AC
  Administered 2014-10-07: 400 mg via INTRAVENOUS

## 2014-10-07 MED ORDER — PHENYLEPHRINE HCL 10 MG/ML IJ SOLN
INTRAMUSCULAR | Status: DC | PRN
  Administered 2014-10-07: 40 ug via INTRAVENOUS

## 2014-10-07 MED ORDER — OXYCODONE HCL 5 MG/5ML PO SOLN: 5.0000 mg | Freq: Once | ORAL | Status: AC | PRN

## 2014-10-07 MED ORDER — SCOPOLAMINE 1 MG/3DAYS TD PT72
1.0000 | MEDICATED_PATCH | Freq: Once | TRANSDERMAL | Status: DC
  Administered 2014-10-07: 1.5 mg via TRANSDERMAL

## 2014-10-07 MED ORDER — DIPHENHYDRAMINE HCL 50 MG/ML IJ SOLN
INTRAMUSCULAR | Status: DC | PRN
  Administered 2014-10-07: 6.25 mg via INTRAVENOUS

## 2014-10-07 MED ORDER — SODIUM BICARBONATE 4 % IV SOLN
INTRAVENOUS | Status: DC | PRN
  Administered 2014-10-07: 300 mL via INTRAMUSCULAR

## 2014-10-07 MED ORDER — OXYCODONE HCL 5 MG PO TABS
ORAL_TABLET | ORAL | Status: AC
  Filled 2014-10-07: qty 1

## 2014-10-07 MED ORDER — LIDOCAINE HCL (CARDIAC) 20 MG/ML IV SOLN
INTRAVENOUS | Status: DC | PRN
  Administered 2014-10-07 (×2): 50 mg via INTRAVENOUS

## 2014-10-07 MED ORDER — CIPROFLOXACIN IN D5W 400 MG/200ML IV SOLN
INTRAVENOUS | Status: AC
  Filled 2014-10-07: qty 200

## 2014-10-07 SURGICAL SUPPLY — 87 items
APPLIER CLIP 9.375 MED OPEN (MISCELLANEOUS)
BAG DECANTER FOR FLEXI CONT (MISCELLANEOUS) ×3 IMPLANT
BENZOIN TINCTURE PRP APPL 2/3 (GAUZE/BANDAGES/DRESSINGS) IMPLANT
BINDER BREAST LRG (GAUZE/BANDAGES/DRESSINGS) IMPLANT
BINDER BREAST MEDIUM (GAUZE/BANDAGES/DRESSINGS) IMPLANT
BINDER BREAST XLRG (GAUZE/BANDAGES/DRESSINGS) IMPLANT
BINDER BREAST XXLRG (GAUZE/BANDAGES/DRESSINGS) IMPLANT
BIOPATCH RED 1 DISK 7.0 (GAUZE/BANDAGES/DRESSINGS) IMPLANT
BLADE HEX COATED 2.75 (ELECTRODE) ×3 IMPLANT
BLADE KNIFE PERSONA 10 (BLADE) ×18 IMPLANT
BLADE SURG 15 STRL LF DISP TIS (BLADE) ×6 IMPLANT
BLADE SURG 15 STRL SS (BLADE) ×3
BNDG GAUZE ELAST 4 BULKY (GAUZE/BANDAGES/DRESSINGS) ×6 IMPLANT
CANISTER SUC SOCK COL 7IN (MISCELLANEOUS) IMPLANT
CANISTER SUCT 1200ML W/VALVE (MISCELLANEOUS) ×3 IMPLANT
CHLORAPREP W/TINT 26ML (MISCELLANEOUS) ×6 IMPLANT
CLIP APPLIE 9.375 MED OPEN (MISCELLANEOUS) IMPLANT
CLSR STERI-STRIP ANTIMIC 1/2X4 (GAUZE/BANDAGES/DRESSINGS) ×6 IMPLANT
COVER BACK TABLE 60X90IN (DRAPES) ×6 IMPLANT
COVER MAYO STAND STRL (DRAPES) ×6 IMPLANT
COVER PROBE W GEL 5X96 (DRAPES) ×3 IMPLANT
DECANTER SPIKE VIAL GLASS SM (MISCELLANEOUS) IMPLANT
DEVICE DUBIN W/COMP PLATE 8390 (MISCELLANEOUS) ×3 IMPLANT
DRAIN CHANNEL 19F RND (DRAIN) IMPLANT
DRAPE LAPAROSCOPIC ABDOMINAL (DRAPES) ×3 IMPLANT
DRAPE PED LAPAROTOMY (DRAPES) ×3 IMPLANT
DRSG PAD ABDOMINAL 8X10 ST (GAUZE/BANDAGES/DRESSINGS) ×6 IMPLANT
DRSG TEGADERM 4X4.75 (GAUZE/BANDAGES/DRESSINGS) IMPLANT
ELECT BLADE 4.0 EZ CLEAN MEGAD (MISCELLANEOUS)
ELECT COATED BLADE 2.86 ST (ELECTRODE) ×3 IMPLANT
ELECT REM PT RETURN 9FT ADLT (ELECTROSURGICAL) ×6
ELECTRODE BLDE 4.0 EZ CLN MEGD (MISCELLANEOUS) IMPLANT
ELECTRODE REM PT RTRN 9FT ADLT (ELECTROSURGICAL) ×4 IMPLANT
EVACUATOR SILICONE 100CC (DRAIN) IMPLANT
GLOVE BIO SURGEON STRL SZ 6.5 (GLOVE) IMPLANT
GLOVE BIO SURGEON STRL SZ7 (GLOVE) IMPLANT
GLOVE BIO SURGEON STRL SZ7.5 (GLOVE) ×3 IMPLANT
GLOVE BIOGEL PI IND STRL 7.5 (GLOVE) IMPLANT
GLOVE BIOGEL PI IND STRL 8 (GLOVE) ×2 IMPLANT
GLOVE BIOGEL PI INDICATOR 7.5 (GLOVE)
GLOVE BIOGEL PI INDICATOR 8 (GLOVE) ×1
GLOVE SURG SS PI 6.5 STRL IVOR (GLOVE) ×9 IMPLANT
GLOVE SURG SS PI 7.0 STRL IVOR (GLOVE) ×12 IMPLANT
GOWN STRL REUS W/ TWL LRG LVL3 (GOWN DISPOSABLE) ×8 IMPLANT
GOWN STRL REUS W/ TWL XL LVL3 (GOWN DISPOSABLE) ×2 IMPLANT
GOWN STRL REUS W/TWL LRG LVL3 (GOWN DISPOSABLE) ×4
GOWN STRL REUS W/TWL XL LVL3 (GOWN DISPOSABLE) ×1
KIT FILL SYSTEM UNIVERSAL (SET/KITS/TRAYS/PACK) ×3 IMPLANT
KIT MARKER MARGIN INK (KITS) ×3 IMPLANT
LIQUID BAND (GAUZE/BANDAGES/DRESSINGS) ×9 IMPLANT
MARKER SKIN DUAL TIP RULER LAB (MISCELLANEOUS) ×3 IMPLANT
NDL SAFETY ECLIPSE 18X1.5 (NEEDLE) ×2 IMPLANT
NEEDLE HYPO 18GX1.5 SHARP (NEEDLE) ×1
NEEDLE HYPO 25X1 1.5 SAFETY (NEEDLE) ×6 IMPLANT
NEEDLE SPNL 18GX3.5 QUINCKE PK (NEEDLE) ×3 IMPLANT
NS IRRIG 1000ML POUR BTL (IV SOLUTION) IMPLANT
PACK BASIN DAY SURGERY FS (CUSTOM PROCEDURE TRAY) ×3 IMPLANT
PAD ALCOHOL SWAB (MISCELLANEOUS) IMPLANT
PENCIL BUTTON HOLSTER BLD 10FT (ELECTRODE) ×3 IMPLANT
SLEEVE SCD COMPRESS KNEE MED (MISCELLANEOUS) ×6 IMPLANT
SPONGE GAUZE 4X4 12PLY STER LF (GAUZE/BANDAGES/DRESSINGS) IMPLANT
SPONGE LAP 18X18 X RAY DECT (DISPOSABLE) ×9 IMPLANT
SPONGE LAP 4X18 X RAY DECT (DISPOSABLE) ×3 IMPLANT
STAPLER VISISTAT 35W (STAPLE) IMPLANT
STRIP CLOSURE SKIN 1/2X4 (GAUZE/BANDAGES/DRESSINGS) IMPLANT
STRIP SUTURE WOUND CLOSURE 1/2 (SUTURE) ×6 IMPLANT
SUT MNCRL AB 4-0 PS2 18 (SUTURE) ×18 IMPLANT
SUT MON AB 3-0 SH 27 (SUTURE) ×4
SUT MON AB 3-0 SH27 (SUTURE) ×8 IMPLANT
SUT MON AB 5-0 PS2 18 (SUTURE) ×12 IMPLANT
SUT PDS AB 2-0 CT2 27 (SUTURE) IMPLANT
SUT SILK 2 0 SH (SUTURE) IMPLANT
SUT SILK 3 0 PS 1 (SUTURE) IMPLANT
SUT VIC AB 2-0 SH 27 (SUTURE) ×1
SUT VIC AB 2-0 SH 27XBRD (SUTURE) ×2 IMPLANT
SUT VIC AB 3-0 SH 27 (SUTURE) ×1
SUT VIC AB 3-0 SH 27X BRD (SUTURE) ×2 IMPLANT
SUT VIC AB 5-0 PS2 18 (SUTURE) IMPLANT
SUT VICRYL 4-0 PS2 18IN ABS (SUTURE) IMPLANT
SYR BULB IRRIGATION 50ML (SYRINGE) ×3 IMPLANT
SYR CONTROL 10ML LL (SYRINGE) ×6 IMPLANT
TOWEL OR 17X24 6PK STRL BLUE (TOWEL DISPOSABLE) ×9 IMPLANT
TOWEL OR NON WOVEN STRL DISP B (DISPOSABLE) IMPLANT
TUBE CONNECTING 20X1/4 (TUBING) ×3 IMPLANT
TUBING SET GRADUATE ASPIR 12FT (MISCELLANEOUS) ×3 IMPLANT
UNDERPAD 30X30 INCONTINENT (UNDERPADS AND DIAPERS) ×6 IMPLANT
YANKAUER SUCT BULB TIP NO VENT (SUCTIONS) ×3 IMPLANT

## 2014-10-07 NOTE — Anesthesia Preprocedure Evaluation (Signed)
Anesthesia Evaluation  Patient identified by MRN, date of birth, ID band Patient awake    Reviewed: Allergy & Precautions, H&P , NPO status , Patient's Chart, lab work & pertinent test results  Airway Mallampati: II  TM Distance: >3 FB Neck ROM: Full    Dental no notable dental hx. (+) Teeth Intact, Dental Advisory Given   Pulmonary neg pulmonary ROS, former smoker,  breath sounds clear to auscultation  Pulmonary exam normal       Cardiovascular negative cardio ROS  Rhythm:Regular Rate:Normal     Neuro/Psych negative neurological ROS  negative psych ROS   GI/Hepatic negative GI ROS, Neg liver ROS,   Endo/Other  negative endocrine ROS  Renal/GU negative Renal ROS  negative genitourinary   Musculoskeletal   Abdominal   Peds  Hematology negative hematology ROS (+)   Anesthesia Other Findings   Reproductive/Obstetrics negative OB ROS                             Anesthesia Physical Anesthesia Plan  ASA: I  Anesthesia Plan: General   Post-op Pain Management:    Induction: Intravenous  Airway Management Planned: LMA and Oral ETT  Additional Equipment:   Intra-op Plan:   Post-operative Plan: Extubation in OR  Informed Consent: I have reviewed the patients History and Physical, chart, labs and discussed the procedure including the risks, benefits and alternatives for the proposed anesthesia with the patient or authorized representative who has indicated his/her understanding and acceptance.   Dental advisory given  Plan Discussed with: CRNA  Anesthesia Plan Comments:         Anesthesia Quick Evaluation

## 2014-10-07 NOTE — Anesthesia Postprocedure Evaluation (Signed)
  Anesthesia Post-op Note  Patient: Laura Wall  Procedure(s) Performed: Procedure(s): BILATERAL MAMMARY REDUCTION  (BREAST) (Bilateral) RIGHT BREAST LUMPECTOMY WITH RADIOACTIVE SEED LOCALIZATION (Right) LIPOSUCTION (Bilateral)  Patient Location: PACU  Anesthesia Type:General  Level of Consciousness: awake and alert   Airway and Oxygen Therapy: Patient Spontanous Breathing  Post-op Pain: moderate  Post-op Assessment: Post-op Vital signs reviewed, Patient's Cardiovascular Status Stable and Respiratory Function Stable  Post-op Vital Signs: Reviewed  Filed Vitals:   10/07/14 1130  BP: 122/60  Pulse: 100  Temp:   Resp: 17    Complications: No apparent anesthesia complications

## 2014-10-07 NOTE — Brief Op Note (Signed)
10/07/2014  10:48 AM  PATIENT:  Laura Wall  53 y.o. female  PRE-OPERATIVE DIAGNOSIS:  FIBROCYSTIC BREAST DISEASE,LEFT BREAST HYPERTROPHY,FIBROADENOSIS OF RIGHT BREAST  POST-OPERATIVE DIAGNOSIS:  FIBROCYSTIC BREAST DISEASE,LEFT BREAST HYPERTROPHY,FIBROADENOSIS OF RIGHT BREAST  PROCEDURE:  Procedure(s): BILATERAL MAMMARY REDUCTION  (BREAST) (Bilateral) RIGHT BREAST LUMPECTOMY WITH RADIOACTIVE SEED LOCALIZATION (Right) LIPOSUCTION (Bilateral)  SURGEON:  Surgeon(s) and Role: Panel 1:    * Claire Sanger, DO - Primary  Panel 2:    * Rolm Bookbinder, MD - Primary  PHYSICIAN ASSISTANT: Shawn Rayburn, PA  ASSISTANTS: none   ANESTHESIA:   general  EBL:  Total I/O In: 2000 [I.V.:2000] Out: -   BLOOD ADMINISTERED:none  DRAINS: none   LOCAL MEDICATIONS USED:  MARCAINE    and LIDOCAINE   SPECIMEN:  Source of Specimen:  breast tissue  DISPOSITION OF SPECIMEN:  PATHOLOGY  COUNTS:  YES  TOURNIQUET:  * No tourniquets in log *  DICTATION: .Dragon Dictation  PLAN OF CARE: Discharge to home after PACU  PATIENT DISPOSITION:  PACU - hemodynamically stable.   Delay start of Pharmacological VTE agent (>24hrs) due to surgical blood loss or risk of bleeding: no

## 2014-10-07 NOTE — Op Note (Signed)
Breast Reduction Op note:    DATE OF PROCEDURE: 10/07/2014  LOCATION:[facility]  SURGEON: Lyndee Leo Sanger  ASSISTANT: Shawn Rayburn, PA  PREOPERATIVE DIAGNOSIS 1. Macromastia 2. Neck Pain 3. Back Pain  POSTOPERATIVE DIAGNOSIS 1. Macromastia 2. Neck Pain 3. Back Pain  PROCEDURES 1. Bilateral breast reduction.  Right reduction 558g, Left reduction 030S  COMPLICATIONS: None.  DRAINS: none  INDICATIONS FOR PROCEDURE The patient, @FNAMEA @ [place], is a 53 y.o. year-old female with a history of symptomatic macromastia with concominant back pain, neck pain, shoulder grooving from her bra.   MRN: [id number]  CONSENT Informed consent was obtained directly from the patient. The risks, benefits and alternatives were fully discussed. Specific risks including but not limited to bleeding, infection, hematoma, seroma, scarring, pain, nipple necrosis, asymmetry, poor cosmetic results, and need for further surgery were discussed. The patient had ample opportunity to have her questions answered to her satisfaction.  DESCRIPTION OF PROCEDURE  Patient was brought into the operating room and placed in a supine position.  SCDs were placed and appropriate padding was performed.  Antibiotics were given. The patient underwent general anesthesia and the chest was prepped and draped in a sterile fashion.  A timeout was performed and all information was confirmed to be correct. Tumescent (170 cc on the left and 150 cc on the right) was placed in the inferior and lateral aspect of the breasts.   Please note that the following procedure described below was performed bilaterally.  Preoperative markings were confirmed.  Incision lines were injected with 1% Xylocaine with epinephrine.  After waiting for vasoconstriction, the marked lines were incised.  A Wise-pattern superomedial breast reduction was performed by de-epithelializing the pedicle, using bovie to create the superomedial  pedicle, and removing breast tissue from the superior, lateral, and inferior portions of the breast.  General surgery performed their portion of the case with the lumpectomy and the seed was removed.  It was at the juncture of the pedicle and vertical portion of the resected area. Care was taken to not undermine the breast pedicle. Hemostasis was achieved.  The nipple was gently rotated into position and the skin was temporarily closed with staples.  The patient was sat upright and size and shape symmetry was confirmed.  The pocket irrigated and hemostasis confirmed.  The deep tissues were approximated with 3-0 monocryl sutures and the skin was closed with deep dermal and subcuticular 4-0 Monocryl sutures.  Liposuction was done to the inferior and lateral aspect of the breasts. The nipple and skin flaps had good capillary refill at the end of the procedure.  The patient tolerated the procedure well. The patient was allowed to wake from anesthesia and taken to the recovery room in satisfactory condition.

## 2014-10-07 NOTE — H&P (Signed)
Laura Wall is an 53 y.o. female.   Chief Complaint: symptomatic mammary hyperplasia HPI: The patient is a 53 yrs old wf here for bilateral breast reduction.  She has a right breast cyst and underwent placement of a seed.  We will work with Dr. Donne Hazel in general surgery for excision and then will do the reduction.  She is otherwise in good health.  She complains of pain in the upper back and neck area.  Past Medical History  Diagnosis Date  . Seasonal allergies   . History of kidney stones   . Ureteral stone pt states stone embedded in urethral sling  . Frequency of urination   . Urgency of urination   . SUI (stress urinary incontinence, female)   . Iron deficiency anemia   . Complication of anesthesia     woke up during some surgeries    Past Surgical History  Procedure Laterality Date  . Ectopic pregnancy surgery  2003    BILATERAL FIMBRIECTOMIES (TUBAL RUPTURE)  . Robotic-assisted total hysterecotmy w/ left ovarian cystectomy  01-14-2009  DR RIVARD    CHRONIC PELVIC PAIN/ UTERINE FIBROIDS/ ENDOMETRIOSIS/ ANEMIA  . Hysteroscopy w/  novasure endometrial ablation  2008    AND URETHRAL SLING PLACEMENT  . Cold knife cervical conization  1982  . Benign left breast lumpectomy  1985  . Tubal ligation  2003    BILATERAL  . Excision melanoma lesion right toe  2009  . Cystoscopy  08/29/2012    Procedure: CYSTOSCOPY;  Surgeon: Alexis Frock, MD;  [facility];  Service: Urology;  Laterality: N/A;  . Holmium laser application  22/03/3334    Procedure: HOLMIUM LASER APPLICATION;  Surgeon: Alexis Frock, MD;  Location: [facility];  Service: Urology;  Laterality: N/A;  There is no left or right designated because the stone is embedded in the urethral sling  . Colonoscopy      Family History  Problem Relation Age of Onset  . Cancer Mother     multiple myeloma  . Cancer Father 24    lung  . Breast cancer Paternal Aunt   . Breast cancer  Paternal Aunt   . Breast cancer Paternal Aunt    Social History:  reports that she quit smoking about 22 years ago. Her smoking use included Cigarettes. She has a 30 pack-year smoking history. She has never used smokeless tobacco. She reports that she drinks about 1.2 oz of alcohol per week. She reports that she does not use illicit drugs.  Allergies:  Allergies  Allergen Reactions  . Codeine Nausea And Vomiting  . Latex Rash  . Penicillins Rash  . Sulfa Antibiotics Rash    severe    Medications Prior to Admission  Medication Sig Dispense Refill  . FeFum-FePoly-FA-B Cmp-C-Biot (INTEGRA PLUS) CAPS Take 1 capsule by mouth daily. 30 capsule 0  . cephALEXin (KEFLEX) 500 MG capsule     . diazepam (VALIUM) 10 MG tablet Take 10 mg by mouth every 6 (six) hours as needed for anxiety.    . diazepam (VALIUM) 2 MG tablet     . ondansetron (ZOFRAN) 4 MG tablet     . traMADol (ULTRAM) 50 MG tablet       Results for orders placed or performed during the hospital encounter of 10/07/14 (from the past 48 hour(s))  Hemoglobin-hemacue, POC     Status: None   Collection Time: 10/07/14  7:02 AM  Result Value Ref Range   Hemoglobin 14.7  12.0 - 15.0 g/dL   No results found.  Review of Systems  Constitutional: Negative.   HENT: Negative.   Eyes: Negative.   Respiratory: Negative.   Cardiovascular: Negative.   Gastrointestinal: Negative.   Genitourinary: Negative.   Musculoskeletal: Negative.   Skin: Negative.   Neurological: Negative.   Psychiatric/Behavioral: Negative.     Blood pressure 122/66, pulse 72, temperature 97.8 F (36.6 C), temperature source Oral, resp. rate 20, height 5' 10"  (1.778 m), weight 83.915 kg (185 lb), SpO2 99 %. Physical Exam  Constitutional: She is oriented to person, place, and time. She appears well-developed and well-nourished.  HENT:  Head: Normocephalic and atraumatic.  Eyes: Conjunctivae are normal. Pupils are equal, round, and reactive to light.   Cardiovascular: Normal rate.   Respiratory: Effort normal.  GI: Soft.  Musculoskeletal: Normal range of motion.  Neurological: She is alert and oriented to person, place, and time.  Skin: Skin is warm.  Psychiatric: She has a normal mood and affect. Her behavior is normal. Judgment and thought content normal.     Assessment/Plan Bilateral breast reduction with liposuction.  Risks and complications were reviewed.  SANGER,Jaylyn Booher 10/07/2014, 7:29 AM

## 2014-10-07 NOTE — Anesthesia Procedure Notes (Signed)
Procedure Name: Intubation Date/Time: 10/07/2014 7:47 AM Performed by: Melynda Ripple D Pre-anesthesia Checklist: Patient identified, Emergency Drugs available, Suction available and Patient being monitored Patient Re-evaluated:Patient Re-evaluated prior to inductionOxygen Delivery Method: Circle System Utilized Preoxygenation: Pre-oxygenation with 100% oxygen Intubation Type: IV induction Ventilation: Mask ventilation without difficulty Laryngoscope Size: Mac and 3 Grade View: Grade I Tube type: Oral Number of attempts: 1 Airway Equipment and Method: stylet and oral airway Placement Confirmation: ETT inserted through vocal cords under direct vision,  positive ETCO2 and breath sounds checked- equal and bilateral Secured at: 22 cm Tube secured with: Tape Dental Injury: Teeth and Oropharynx as per pre-operative assessment

## 2014-10-07 NOTE — Transfer of Care (Signed)
Immediate Anesthesia Transfer of Care Note  Patient: Laura Wall  Procedure(s) Performed: Procedure(s): BILATERAL MAMMARY REDUCTION  (BREAST) (Bilateral) RIGHT BREAST LUMPECTOMY WITH RADIOACTIVE SEED LOCALIZATION (Right) LIPOSUCTION (Bilateral)  Patient Location: PACU  Anesthesia Type:General  Level of Consciousness: awake, alert  and oriented  Airway & Oxygen Therapy: Patient Spontanous Breathing and Patient connected to face mask oxygen  Post-op Assessment: Report given to PACU RN and Post -op Vital signs reviewed and stable  Post vital signs: Reviewed and stable  Complications: No apparent anesthesia complications

## 2014-10-07 NOTE — H&P (Signed)
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 US shows a 1.5 cm lobulated mass in this position that was suspicious. There was another lesion in the retroareolar region that was also present and probably benign. US guided biopsy performed that showed a biphasic epithelial and stromal lesion. This was followed up in 03/2014 and there is a 1.1 cm lobulated mass still. She desires breast reduction and would like to have this mass excised in combination due to concern.    Other Problems  Arthritis Back Pain Bladder Problems Cancer Heart murmur Kidney Stone Lump In Breast Melanoma  Past Surgical History Breast Biopsy Left. Colon Polyp Removal - Colonoscopy Foot Surgery Right. Hysterectomy (not due to cancer) - Complete Oral Surgery  Allergies Codeine Phosphate *ANALGESICS - OPIOID* Latex Exam Gloves *MEDICAL DEVICES AND SUPPLIES* Penicillin G Benzathine & Proc *PENICILLINS* Sulfabenzamide *CHEMICALS*  Medication History  Diazepam (10MG  Tablet, Oral) Active. Integra Plus (Oral) Active. Medications Reconciled  Social History Lars Mage [place], [place]; 09/07/2014 9:20 AM) Alcohol use Occasional alcohol use. Caffeine use Carbonated beverages, Coffee, Tea. No drug use Tobacco use Former smoker.  Family History Alcohol Abuse Father. Breast Cancer Family Members In General. Cancer Mother. Cerebrovascular Accident Mother. Colon Cancer Father. Ischemic Bowel Disease Mother. Kidney Disease Mother. Melanoma Mother. Respiratory Condition Father.  Pregnancy / Birth History  Age at menarche 46 years. Gravida 1 Maternal age 31-30 Para 1  Review of Systems General Not Present- Appetite Loss, Chills, Fatigue, Fever, Night Sweats, Weight Gain and Weight Loss. HEENT Present- Wears glasses/contact lenses. Not Present- Earache, Hearing Loss, Hoarseness,  Nose Bleed, Oral Ulcers, Ringing in the Ears, Seasonal Allergies, Sinus Pain, Sore Throat, Visual Disturbances and Yellow Eyes. Respiratory Not Present- Bloody sputum, Chronic Cough, Difficulty Breathing, Snoring and Wheezing. Breast Present- Breast Mass and Breast Pain. Not Present- Nipple Discharge and Skin Changes. Cardiovascular Not Present- Chest Pain, Difficulty Breathing Lying Down, Leg Cramps, Palpitations, Rapid Heart Rate, Shortness of Breath and Swelling of Extremities. Gastrointestinal Not Present- Abdominal Pain, Bloating, Bloody Stool, Change in Bowel Habits, Chronic diarrhea, Constipation, Difficulty Swallowing, Excessive gas, Gets full quickly at meals, Hemorrhoids, Indigestion, Nausea, Rectal Pain and Vomiting. Female Genitourinary Present- Frequency and Urgency. Not Present- Nocturia, Painful Urination and Pelvic Pain. Musculoskeletal Present- Back Pain. Not Present- Joint Pain, Joint Stiffness, Muscle Pain, Muscle Weakness and Swelling of Extremities. Neurological Not Present- Decreased Memory, Fainting, Headaches, Numbness, Seizures, Tingling, Tremor, Trouble walking and Weakness. Psychiatric Not Present- Anxiety, Bipolar, Change in Sleep Pattern, Depression, Fearful and Frequent crying. Endocrine Present- Hot flashes. Not Present- Cold Intolerance, Excessive Hunger, Hair Changes, Heat Intolerance and New Diabetes. Hematology Not Present- Easy Bruising, Excessive bleeding, Gland problems, HIV and Persistent Infections.   Vitals Weight: 186 lb Height: 70in Body Surface Area: 2.04 m Body Mass Index: 26.69 kg/m BP: 130/74 (Sitting, Left Arm, Standard)    Physical Exam  General Mental Status-Alert. Orientation-Oriented X3.  Eye Sclera/Conjunctiva - Bilateral-No scleral icterus.  Chest and Lung Exam Chest and lung exam reveals -quiet, even and easy respiratory effort with no use of accessory muscles and on auscultation, normal breath sounds, no  adventitious sounds and normal vocal resonance.  Breast Nipples Discharge - Bilateral - None. Breast - Bilateral-Normal. Breast Lump-No Palpable Breast Mass.  Cardiovascular Cardiovascular examination reveals -normal heart sounds, regular rate and rhythm with no murmurs.  Lymphatic Head & Neck  General Head & Neck Lymphatics: Bilateral -  Description - Normal. Axillary  General Axillary Region: Bilateral - Description - Normal. Note: no [place] adenopathy     Assessment & Plan  BREAST MASS, RIGHT (611.72  N63) Impression: Right breast mass radioactive seed guided excision I discussed excising this mass using rsl at the same time as a reduction with Dr Migdalia Dk. Can coordinate this for December per patient request

## 2014-10-07 NOTE — Op Note (Signed)
Preoperative diagnosis: Right breast mass. Postoperative diagnosis: Same as above Procedure: Right breast radioactive seed guided excisional biopsy Surgeon: Dr. Serita Grammes Anesthesia: Gen. Estimate blood loss: Minimal Drains: None Complications: None Specimens: Right breast marked with paint Disposition to plastic surgery for completion of case  Indications: This a 53 yo female undergoing reduction mammoplasty who also has a right breast mass that appeared on mammography. This has been biopsied and is benign. She would like this area excised. She had a radioactive seed placement prior to [place] beginning.  Procedure:After informed consent was obtained the patient was taken to the operating room. I had her mammograms in the operating room. She was given antibiotics. She had sequential compression devices on her legs. She was placed under general anesthesia without complication. Her breast was prepped and draped in the standard sterile surgical fashion. A surgical timeout was then performed.  Dr. Migdalia Dk of plastic surgery marked out an incision and began doing the reduction mammoplasty. I identified the location of the seed prior to this. I then used the neoprobe to identify the location of the seed. I used cautery to excise these and the surrounding tissue. This was then marked with paint. There was no more radioactivity in the breast. I did a Faxitron mammogram confirming removal of the clip in the seed. The case was then taken back over by Dr. Migdalia Dk for completion.

## 2014-10-07 NOTE — Discharge Instructions (Signed)
May shower starting tomorrow Continue binder or sports bra No heavy lifting  Call your surgeon if you experience:   1.  Fever over 101.0. 2.  Inability to urinate. 3.  Nausea and/or vomiting. 4.  Extreme swelling or bruising at the surgical site. 5.  Continued bleeding from the incision. 6.  Increased pain, redness or drainage from the incision. 7.  Problems related to your pain medication. 8. Any change in color, movement and/or sensation 9. Any problems and/or concerns   Post Anesthesia Home Care Instructions  Activity: Get plenty of rest for the remainder of the day. A responsible adult should stay with you for 24 hours following the procedure.  For the next 24 hours, DO NOT: -Drive a car -Paediatric nurse -Drink alcoholic beverages -Take any medication unless instructed by your physician -Make any legal decisions or sign important papers.  Meals: Start with liquid foods such as gelatin or soup. Progress to regular foods as tolerated. Avoid greasy, spicy, heavy foods. If nausea and/or vomiting occur, drink only clear liquids until the nausea and/or vomiting subsides. Call your physician if vomiting continues.  Special Instructions/Symptoms: Your throat may feel dry or sore from the anesthesia or the breathing tube placed in your throat during surgery. If this causes discomfort, gargle with warm salt water. The discomfort should disappear within 24 hours.

## 2014-10-07 NOTE — Interval H&P Note (Signed)
History and Physical Interval Note:  10/07/2014 7:25 AM  Laura Wall  has presented today for surgery, with the diagnosis of FIBROCYSTIC BREAST DISEASE,LEFT BREAST HYPERTROPHY,FIBROADENOSIS OF RIGHT BREAST  The various methods of treatment have been discussed with the patient and family. After consideration of risks, benefits and other options for treatment, the patient has consented to  Procedure(s): BILATERAL MAMMARY REDUCTION  (BREAST) (Bilateral) RIGHT BREAST LUMPECTOMY WITH RADIOACTIVE SEED LOCALIZATION (Right) as a surgical intervention .  The patient's history has been reviewed, patient examined, no change in status, stable for surgery.  I have reviewed the patient's chart and labs.  Questions were answered to the patient's satisfaction.     [name]

## 2014-10-08 ENCOUNTER — Encounter (HOSPITAL_BASED_OUTPATIENT_CLINIC_OR_DEPARTMENT_OTHER): Payer: Self-pay | Admitting: Plastic Surgery

## 2015-03-18 DIAGNOSIS — Z719 Counseling, unspecified: Secondary | ICD-10-CM | POA: Insufficient documentation

## 2015-07-12 ENCOUNTER — Other Ambulatory Visit (HOSPITAL_BASED_OUTPATIENT_CLINIC_OR_DEPARTMENT_OTHER): Payer: Self-pay | Admitting: General Surgery

## 2015-07-12 ENCOUNTER — Ambulatory Visit (HOSPITAL_COMMUNITY)
Admission: RE | Admit: 2015-07-12 | Discharge: 2015-07-12 | Disposition: A | Discharge status: Home / Self Care | Payer: Managed Care, Other (non HMO) | Source: Ambulatory Visit | Attending: General Surgery | Admitting: General Surgery

## 2015-07-12 DIAGNOSIS — W228XXA Striking against or struck by other objects, initial encounter: Secondary | ICD-10-CM | POA: Insufficient documentation

## 2015-07-12 DIAGNOSIS — Y92481 Parking lot as the place of occurrence of the external cause: Secondary | ICD-10-CM | POA: Insufficient documentation

## 2015-07-12 DIAGNOSIS — S99922A Unspecified injury of left foot, initial encounter: Secondary | ICD-10-CM | POA: Diagnosis not present

## 2015-07-12 DIAGNOSIS — T148XXA Other injury of unspecified body region, initial encounter: Secondary | ICD-10-CM

## 2015-11-02 MED FILL — FOLIVANE-PLUS CAPSULE: 30 days supply | Qty: 30 | Fill #1

## 2015-11-04 MED FILL — AZITHROMYCIN 250 MG TABLET: 250 | 5 days supply | Qty: 6 | Fill #0

## 2015-11-04 MED FILL — ETODOLAC 400 MG TABLET: 400 | 7 days supply | Qty: 20 | Fill #0

## 2015-12-20 MED FILL — INTEGRA PLUS CAPSULE: 30 days supply | Qty: 30 | Fill #2 | Status: TO

## 2016-01-01 ENCOUNTER — Emergency Department (HOSPITAL_COMMUNITY)
Admission: EM | Admit: 2016-01-01 | Discharge: 2016-01-01 | Disposition: A | Discharge status: Home / Self Care | Payer: Managed Care, Other (non HMO) | Source: Home / Self Care | Attending: Family Medicine | Admitting: Family Medicine

## 2016-01-01 ENCOUNTER — Emergency Department (INDEPENDENT_AMBULATORY_CARE_PROVIDER_SITE_OTHER): Payer: Managed Care, Other (non HMO)

## 2016-01-01 ENCOUNTER — Encounter (HOSPITAL_COMMUNITY): Payer: Self-pay

## 2016-01-01 DIAGNOSIS — S92502A Displaced unspecified fracture of left lesser toe(s), initial encounter for closed fracture: Secondary | ICD-10-CM

## 2016-01-01 MED ORDER — KETOROLAC TROMETHAMINE 60 MG/2ML IM SOLN
60.0000 mg | Freq: Once | INTRAMUSCULAR | Status: AC
  Administered 2016-01-01: 60 mg via INTRAMUSCULAR

## 2016-01-01 MED ORDER — HYDROCODONE-ACETAMINOPHEN 5-325 MG PO TABS: 1.0000 | ORAL_TABLET | Freq: Four times a day (QID) | ORAL | Status: DC | PRN

## 2016-01-01 MED ORDER — BUPIVACAINE HCL (PF) 0.5 % IJ SOLN
INTRAMUSCULAR | Status: AC
  Filled 2016-01-01: qty 10

## 2016-01-01 MED ORDER — KETOROLAC TROMETHAMINE 60 MG/2ML IM SOLN
INTRAMUSCULAR | Status: AC
  Filled 2016-01-01: qty 2

## 2016-01-01 NOTE — Discharge Instructions (Signed)
It was a pleasure to see you today. The left third toe has an oblique fracture.   We performed a digital block with Marcaine and buddy taped to the second toe.  You also received an injection of Toradol 60mg  IM.  Post-op shoe and crutches, no weight bearing until you see the orthopedist.   Contact information for the orthopedist on-call (Dr Lorin Mercy, [facility]). Call tomorrow for appointment.   Ice and elevation at home.  Do not take any more anti-inflammaatory medicines (ibuprofen, naproxen, etc) tonight.  Norco as needed for pain.

## 2016-01-01 NOTE — ED Notes (Signed)
Patient states she was walking out of the bathroom at home When she jammed her left middle toe on the door jam Patient states the toe was "just hanging" and looks crooked Patient is having a lot of pain

## 2016-01-01 NOTE — ED Provider Notes (Signed)
CSN: [id number]     Arrival date & time 01/01/16  1501 History   First MD Initiated Contact with Patient 01/01/16 1716     Chief Complaint  Patient presents with  . Toe Pain   (Consider location/radiation/quality/duration/timing/severity/associated sxs/prior Treatment) Patient is a 55 y.o. female presenting with toe pain. The history is provided by the patient. No language interpreter was used.  Toe Pain  Patient presents with pain in L third toe after running into a door jamb in her bathroom at 2pm this afternoon. The toe was superimposed on the 4th toe, she manually straightened and came to the [facility].  Drove herself to [facility] today.  Some nausea.    History of several broken toes resulting from similar accidents.   Social Hx Works at [employer] affiliated with [employer].   Allergy: reports GI intolerance of codeine.  Has been prescribed hydrocodone in the past without incident.   Past Medical History  Diagnosis Date  . Seasonal allergies   . History of kidney stones   . Ureteral stone pt states stone embedded in urethral sling  . Frequency of urination   . Urgency of urination   . SUI (stress urinary incontinence, female)   . Iron deficiency anemia   . Complication of anesthesia     woke up during some surgeries   Past Surgical History  Procedure Laterality Date  . Ectopic pregnancy surgery  2003    BILATERAL FIMBRIECTOMIES (TUBAL RUPTURE)  . Robotic-assisted total hysterecotmy w/ left ovarian cystectomy  01-14-2009  DR RIVARD    CHRONIC PELVIC PAIN/ UTERINE FIBROIDS/ ENDOMETRIOSIS/ ANEMIA  . Hysteroscopy w/  novasure endometrial ablation  2008    AND URETHRAL SLING PLACEMENT  . Cold knife cervical conization  1982  . Benign left breast lumpectomy  1985  . Tubal ligation  2003    BILATERAL  . Excision melanoma lesion right toe  2009  . Cystoscopy  08/29/2012    Procedure: CYSTOSCOPY;  Surgeon: Alexis Frock, MD;  Location[facility];  Service:  Urology;  Laterality: N/A;  . Holmium laser application  95/11/8411    Procedure: HOLMIUM LASER APPLICATION;  Surgeon: Alexis Frock, MD;  Location: [facility];  Service: Urology;  Laterality: N/A;  There is no left or right designated because the stone is embedded in the urethral sling  . Colonoscopy    . Breast reduction surgery Bilateral 10/07/2014    Procedure: BILATERAL MAMMARY REDUCTION  (BREAST);  Surgeon: Theodoro Kos, DO;  Location: [place];  Service: Plastics;  Laterality: Bilateral;  . Liposuction Bilateral 10/07/2014    Procedure: LIPOSUCTION;  Surgeon: Theodoro Kos, DO;  Location: [place];  Service: Plastics;  Laterality: Bilateral;  . Breast lumpectomy with radioactive seed localization Right 10/07/2014    Procedure: RIGHT BREAST LUMPECTOMY WITH RADIOACTIVE SEED LOCALIZATION;  Surgeon: Rolm Bookbinder, MD;  Location: [place];  Service: General;  Laterality: Right;   Family History  Problem Relation Age of Onset  . Cancer Mother     multiple myeloma  . Cancer Father 88    lung  . Breast cancer Paternal Aunt   . Breast cancer Paternal Aunt   . Breast cancer Paternal Aunt    Social History  Substance Use Topics  . Smoking status: Former Smoker -- 3.00 packs/day for 10 years    Types: Cigarettes    Quit date: 08/26/1992  . Smokeless tobacco: Never Used  . Alcohol Use: 1.2  oz/week    2 Glasses of wine per week   OB History    Gravida Para Term Preterm AB TAB SAB Ectopic Multiple Living   _0 Review of Systems  Constitutional: Negative for fever, chills, appetite change and fatigue.    Allergies  Codeine; Latex; Penicillins; and Sulfa antibiotics  Home Medications   Prior to Admission medications   Medication Sig Start Date End Date Taking? Authorizing Provider  cephALEXin (KEFLEX) 500 MG capsule  09/28/14   Historical Provider, MD  diazepam (VALIUM) 10 MG tablet Take 10 mg by  mouth every 6 (six) hours as needed for anxiety.    Historical Provider, MD  diazepam (VALIUM) 2 MG tablet  09/28/14   Historical Provider, MD  FeFum-FePoly-FA-B Cmp-C-Biot (INTEGRA PLUS) CAPS Take 1 capsule by mouth daily. 06/27/12   Delsa Bern, MD  HYDROcodone-acetaminophen (NORCO) 5-325 MG tablet Take 1-2 tablets by mouth every 6 (six) hours as needed for moderate pain. 01/01/16   Willeen Niece, MD  ondansetron [facility]) 4 MG tablet  09/28/14   Historical Provider, MD  traMADol Veatrice Bourbon) 50 MG tablet  09/28/14   Historical Provider, MD   Meds Ordered and Administered this Visit   Medications  ketorolac (TORADOL) injection 60 mg (60 mg Intramuscular Given 01/01/16 1750)    BP 131/54 mmHg  Pulse 81  Temp(Src) 98 F (36.7 C) (Oral)  Resp 14  SpO2 100% No data found.   Physical Exam  Constitutional: She appears well-developed and well-nourished.  Neck: Normal range of motion. Neck supple.  Musculoskeletal:  Left third toe laterally deviated.  No tenderness over metarsals.   Palpable dp pulse in L foot.  Ankle with full ROM dorsi/plantarflexion.   Patient bears weight on L heel when returning from x-ray.   Lymphadenopathy:    She has no cervical adenopathy.  Neurological:  Sensation in distal L third toe intact.  Brisk capillary refill < 2 seconds.     ED Course  Procedures (including critical care time)  Labs Review Labs Reviewed - No data to display  Imaging Review Dg Foot Complete Left  01/01/2016  CLINICAL DATA:  Toe injury.  Pain. EXAM: LEFT FOOT - COMPLETE 3+ VIEW COMPARISON:  07/12/2015 toe radiographs. FINDINGS: Oblique fracture involving the proximal phalanx of the third digit. No intra-articular extension. IMPRESSION: Proximal phalangeal fracture, third digit. Electronically Signed   By: Abigail Miyamoto M.D.   On: 01/01/2016 17:27     Visual Acuity Review  Right Eye Distance:   Left Eye Distance:   Bilateral Distance:    Right Eye Near:   Left Eye Near:     Bilateral Near:         MDM  No diagnosis found. L third toe oblique fracture of proximal phalynx, reviewed films independently.    Marcaine digital block performed, gentle traction and taped to second digit.  Tolerated well.   Crutches, post-op shoe, and instructions to follow up with Orthopedics in AM.  She is given Rx for Norco to use as needed. Ice and elevation.   Dalbert Mayotte, MD    Willeen Niece, MD 01/01/16 [phone]

## 2016-01-06 ENCOUNTER — Other Ambulatory Visit: Payer: Self-pay | Admitting: Orthopedic Surgery

## 2016-01-09 ENCOUNTER — Encounter (HOSPITAL_BASED_OUTPATIENT_CLINIC_OR_DEPARTMENT_OTHER): Payer: Self-pay | Admitting: *Deleted

## 2016-01-12 ENCOUNTER — Encounter (HOSPITAL_BASED_OUTPATIENT_CLINIC_OR_DEPARTMENT_OTHER): Payer: Self-pay | Admitting: *Deleted

## 2016-01-12 ENCOUNTER — Ambulatory Visit (HOSPITAL_BASED_OUTPATIENT_CLINIC_OR_DEPARTMENT_OTHER): Payer: Managed Care, Other (non HMO) | Admitting: Anesthesiology

## 2016-01-12 ENCOUNTER — Ambulatory Visit (HOSPITAL_BASED_OUTPATIENT_CLINIC_OR_DEPARTMENT_OTHER)
Admission: RE | Admit: 2016-01-12 | Discharge: 2016-01-12 | Disposition: A | Discharge status: Home / Self Care | Payer: Managed Care, Other (non HMO) | Source: Ambulatory Visit | Attending: Orthopedic Surgery | Admitting: Orthopedic Surgery

## 2016-01-12 ENCOUNTER — Encounter (HOSPITAL_BASED_OUTPATIENT_CLINIC_OR_DEPARTMENT_OTHER)
Admission: RE | Disposition: A | Discharge status: Home / Self Care | Payer: Self-pay | Source: Ambulatory Visit | Attending: Orthopedic Surgery

## 2016-01-12 DIAGNOSIS — W2209XA Striking against other stationary object, initial encounter: Secondary | ICD-10-CM | POA: Insufficient documentation

## 2016-01-12 DIAGNOSIS — S92912D Unspecified fracture of left toe(s), subsequent encounter for fracture with routine healing: Secondary | ICD-10-CM

## 2016-01-12 DIAGNOSIS — Z85820 Personal history of malignant melanoma of skin: Secondary | ICD-10-CM | POA: Diagnosis not present

## 2016-01-12 DIAGNOSIS — Z79899 Other long term (current) drug therapy: Secondary | ICD-10-CM | POA: Diagnosis not present

## 2016-01-12 DIAGNOSIS — D509 Iron deficiency anemia, unspecified: Secondary | ICD-10-CM | POA: Insufficient documentation

## 2016-01-12 DIAGNOSIS — Z87891 Personal history of nicotine dependence: Secondary | ICD-10-CM | POA: Insufficient documentation

## 2016-01-12 DIAGNOSIS — S92512A Displaced fracture of proximal phalanx of left lesser toe(s), initial encounter for closed fracture: Secondary | ICD-10-CM | POA: Diagnosis not present

## 2016-01-12 HISTORY — DX: Unspecified fracture of left toe(s), initial encounter for closed fracture: S92.912A

## 2016-01-12 HISTORY — PX: ORIF TOE FRACTURE: SHX5032

## 2016-01-12 SURGERY — OPEN REDUCTION INTERNAL FIXATION (ORIF) METATARSAL (TOE) FRACTURE
Anesthesia: General | Site: Toe | Laterality: Left

## 2016-01-12 MED ORDER — CEFAZOLIN SODIUM-DEXTROSE 2-3 GM-% IV SOLR
2.0000 g | INTRAVENOUS | Status: AC
  Administered 2016-01-12: 2 g via INTRAVENOUS

## 2016-01-12 MED ORDER — FENTANYL CITRATE (PF) 100 MCG/2ML IJ SOLN
INTRAMUSCULAR | Status: AC
  Filled 2016-01-12: qty 2

## 2016-01-12 MED ORDER — BUPIVACAINE-EPINEPHRINE 0.5% -1:200000 IJ SOLN
INTRAMUSCULAR | Status: DC | PRN
  Administered 2016-01-12: 6 mL

## 2016-01-12 MED ORDER — TRAMADOL HCL 50 MG PO TABS: 50.0000 mg | ORAL_TABLET | Freq: Four times a day (QID) | ORAL | Status: DC | PRN

## 2016-01-12 MED ORDER — SODIUM CHLORIDE 0.9 % IV SOLN: INTRAVENOUS | Status: DC

## 2016-01-12 MED ORDER — KETOROLAC TROMETHAMINE 30 MG/ML IJ SOLN
INTRAMUSCULAR | Status: DC | PRN
  Administered 2016-01-12: 30 mg via INTRAVENOUS

## 2016-01-12 MED ORDER — LIDOCAINE HCL (CARDIAC) 20 MG/ML IV SOLN
INTRAVENOUS | Status: AC
  Filled 2016-01-12: qty 5

## 2016-01-12 MED ORDER — MIDAZOLAM HCL 2 MG/2ML IJ SOLN
1.0000 mg | INTRAMUSCULAR | Status: DC | PRN
  Administered 2016-01-12: 2 mg via INTRAVENOUS

## 2016-01-12 MED ORDER — MEPERIDINE HCL 25 MG/ML IJ SOLN: 6.2500 mg | INTRAMUSCULAR | Status: DC | PRN

## 2016-01-12 MED ORDER — OXYCODONE HCL 5 MG PO TABS: 5.0000 mg | ORAL_TABLET | Freq: Once | ORAL | Status: DC | PRN

## 2016-01-12 MED ORDER — ONDANSETRON HCL 4 MG/2ML IJ SOLN
INTRAMUSCULAR | Status: AC
  Filled 2016-01-12: qty 2

## 2016-01-12 MED ORDER — CHLORHEXIDINE GLUCONATE 4 % EX LIQD: 60.0000 mL | Freq: Once | CUTANEOUS | Status: DC

## 2016-01-12 MED ORDER — PROPOFOL 10 MG/ML IV BOLUS
INTRAVENOUS | Status: DC | PRN
  Administered 2016-01-12: 200 mg via INTRAVENOUS

## 2016-01-12 MED ORDER — DEXAMETHASONE SODIUM PHOSPHATE 10 MG/ML IJ SOLN
INTRAMUSCULAR | Status: AC
  Filled 2016-01-12: qty 1

## 2016-01-12 MED ORDER — SCOPOLAMINE 1 MG/3DAYS TD PT72: 1.0000 | MEDICATED_PATCH | Freq: Once | TRANSDERMAL | Status: DC | PRN

## 2016-01-12 MED ORDER — BUPIVACAINE-EPINEPHRINE (PF) 0.5% -1:200000 IJ SOLN
INTRAMUSCULAR | Status: AC
  Filled 2016-01-12: qty 30

## 2016-01-12 MED ORDER — LIDOCAINE HCL (CARDIAC) 20 MG/ML IV SOLN
INTRAVENOUS | Status: DC | PRN
  Administered 2016-01-12: 60 mg via INTRAVENOUS

## 2016-01-12 MED ORDER — DEXAMETHASONE SODIUM PHOSPHATE 4 MG/ML IJ SOLN
INTRAMUSCULAR | Status: DC | PRN
  Administered 2016-01-12: 10 mg via INTRAVENOUS

## 2016-01-12 MED ORDER — FENTANYL CITRATE (PF) 100 MCG/2ML IJ SOLN
50.0000 ug | INTRAMUSCULAR | Status: DC | PRN
  Administered 2016-01-12: 100 ug via INTRAVENOUS

## 2016-01-12 MED ORDER — KETOROLAC TROMETHAMINE 30 MG/ML IJ SOLN
INTRAMUSCULAR | Status: AC
  Filled 2016-01-12: qty 1

## 2016-01-12 MED ORDER — 0.9 % SODIUM CHLORIDE (POUR BTL) OPTIME
TOPICAL | Status: DC | PRN
  Administered 2016-01-12: 120 mL

## 2016-01-12 MED ORDER — MIDAZOLAM HCL 2 MG/2ML IJ SOLN
INTRAMUSCULAR | Status: AC
  Filled 2016-01-12: qty 2

## 2016-01-12 MED ORDER — GLYCOPYRROLATE 0.2 MG/ML IJ SOLN: 0.2000 mg | Freq: Once | INTRAMUSCULAR | Status: DC | PRN

## 2016-01-12 MED ORDER — LACTATED RINGERS IV SOLN
INTRAVENOUS | Status: DC
Start: 2016-01-12 — End: 2016-01-12
  Administered 2016-01-12 (×2): via INTRAVENOUS

## 2016-01-12 MED ORDER — VANCOMYCIN HCL IN DEXTROSE 1-5 GM/200ML-% IV SOLN: 1000.0000 mg | INTRAVENOUS | Status: DC

## 2016-01-12 MED ORDER — OXYCODONE HCL 5 MG/5ML PO SOLN: 5.0000 mg | Freq: Once | ORAL | Status: DC | PRN

## 2016-01-12 MED ORDER — CEFAZOLIN SODIUM-DEXTROSE 2-3 GM-% IV SOLR
INTRAVENOUS | Status: AC
  Filled 2016-01-12: qty 50

## 2016-01-12 MED ORDER — ONDANSETRON HCL 4 MG/2ML IJ SOLN
INTRAMUSCULAR | Status: DC | PRN
  Administered 2016-01-12: 4 mg via INTRAVENOUS

## 2016-01-12 MED ORDER — HYDROMORPHONE HCL 1 MG/ML IJ SOLN: 0.2500 mg | INTRAMUSCULAR | Status: DC | PRN

## 2016-01-12 MED ORDER — PROPOFOL 10 MG/ML IV BOLUS
INTRAVENOUS | Status: AC
  Filled 2016-01-12: qty 20

## 2016-01-12 MED FILL — traMADol HCL 50 MG TABS: 50 | 3 days supply | Qty: 20 | Fill #0

## 2016-01-12 MED FILL — HYDROCODON-APAP 5-325: 5-325 | 4 days supply | Qty: 30 | Fill #0

## 2016-01-12 SURGICAL SUPPLY — 69 items
BANDAGE ACE 4X5 VEL STRL LF (GAUZE/BANDAGES/DRESSINGS) ×3 IMPLANT
BANDAGE ESMARK 6X9 LF (GAUZE/BANDAGES/DRESSINGS) IMPLANT
BLADE SURG 15 STRL LF DISP TIS (BLADE) ×1 IMPLANT
BLADE SURG 15 STRL SS (BLADE) ×2
BNDG COHESIVE 4X5 TAN STRL (GAUZE/BANDAGES/DRESSINGS) IMPLANT
BNDG COHESIVE 6X5 TAN STRL LF (GAUZE/BANDAGES/DRESSINGS) IMPLANT
BNDG CONFORM 2 STRL LF (GAUZE/BANDAGES/DRESSINGS) IMPLANT
BNDG ESMARK 4X9 LF (GAUZE/BANDAGES/DRESSINGS) IMPLANT
BNDG ESMARK 6X9 LF (GAUZE/BANDAGES/DRESSINGS)
CANISTER SUCT 1200ML W/VALVE (MISCELLANEOUS) IMPLANT
CAP PIN ORTHO PINK (CAP) IMPLANT
CAP PIN PROTECTOR ORTHO WHT (CAP) ×3 IMPLANT
CHLORAPREP W/TINT 26ML (MISCELLANEOUS) ×3 IMPLANT
COVER BACK TABLE 60X90IN (DRAPES) ×3 IMPLANT
CUFF TOURNIQUET SINGLE 34IN LL (TOURNIQUET CUFF) IMPLANT
DECANTER SPIKE VIAL GLASS SM (MISCELLANEOUS) IMPLANT
DRAPE EXTREMITY T 121X128X90 (DRAPE) ×3 IMPLANT
DRAPE OEC MINIVIEW 54X84 (DRAPES) ×3 IMPLANT
DRAPE U-SHAPE 47X51 STRL (DRAPES) ×3 IMPLANT
DRSG MEPITEL 4X7.2 (GAUZE/BANDAGES/DRESSINGS) IMPLANT
DRSG PAD ABDOMINAL 8X10 ST (GAUZE/BANDAGES/DRESSINGS) IMPLANT
ELECT REM PT RETURN 9FT ADLT (ELECTROSURGICAL) ×3
ELECTRODE REM PT RTRN 9FT ADLT (ELECTROSURGICAL) ×1 IMPLANT
GAUZE SPONGE 4X4 12PLY STRL (GAUZE/BANDAGES/DRESSINGS) ×3 IMPLANT
GLOVE BIO SURGEON STRL SZ8 (GLOVE) ×3 IMPLANT
GLOVE BIOGEL PI IND STRL 8 (GLOVE) ×1 IMPLANT
GLOVE BIOGEL PI INDICATOR 8 (GLOVE) ×2
GLOVE ECLIPSE 7.5 STRL STRAW (GLOVE) ×3 IMPLANT
GLOVE EXAM NITRILE MD LF STRL (GLOVE) ×3 IMPLANT
GLOVE SURG SS PI 6.5 STRL IVOR (GLOVE) ×3 IMPLANT
GLOVE SURG SS PI 8.0 STRL IVOR (GLOVE) ×3 IMPLANT
GOWN STRL REUS W/ TWL LRG LVL3 (GOWN DISPOSABLE) ×1 IMPLANT
GOWN STRL REUS W/ TWL XL LVL3 (GOWN DISPOSABLE) ×1 IMPLANT
GOWN STRL REUS W/TWL LRG LVL3 (GOWN DISPOSABLE) ×2
GOWN STRL REUS W/TWL XL LVL3 (GOWN DISPOSABLE) ×2
K-WIRE SGLE END .054 LG (WIRE) ×3
KWIRE SGLE END .054 LG (WIRE) ×1 IMPLANT
NEEDLE HYPO 22GX1.5 SAFETY (NEEDLE) ×3 IMPLANT
NS IRRIG 1000ML POUR BTL (IV SOLUTION) ×3 IMPLANT
PACK BASIN DAY SURGERY FS (CUSTOM PROCEDURE TRAY) ×3 IMPLANT
PAD CAST 4YDX4 CTTN HI CHSV (CAST SUPPLIES) ×1 IMPLANT
PADDING CAST ABS 4INX4YD NS (CAST SUPPLIES)
PADDING CAST ABS COTTON 4X4 ST (CAST SUPPLIES) IMPLANT
PADDING CAST COTTON 4X4 STRL (CAST SUPPLIES) ×2
PADDING CAST COTTON 6X4 STRL (CAST SUPPLIES) IMPLANT
PENCIL BUTTON HOLSTER BLD 10FT (ELECTRODE) ×3 IMPLANT
SANITIZER HAND PURELL 535ML FO (MISCELLANEOUS) ×3 IMPLANT
SHEET MEDIUM DRAPE 40X70 STRL (DRAPES) ×3 IMPLANT
SLEEVE SCD COMPRESS KNEE MED (MISCELLANEOUS) ×3 IMPLANT
SPLINT FAST PLASTER 5X30 (CAST SUPPLIES)
SPLINT PLASTER CAST FAST 5X30 (CAST SUPPLIES) IMPLANT
SPONGE LAP 18X18 X RAY DECT (DISPOSABLE) ×3 IMPLANT
STOCKINETTE 6  STRL (DRAPES) ×2
STOCKINETTE 6 STRL (DRAPES) ×1 IMPLANT
SUCTION FRAZIER HANDLE 10FR (MISCELLANEOUS)
SUCTION TUBE FRAZIER 10FR DISP (MISCELLANEOUS) IMPLANT
SUT ETHILON 3 0 PS 1 (SUTURE) IMPLANT
SUT FIBERWIRE #2 38 T-5 BLUE (SUTURE)
SUT MNCRL AB 3-0 PS2 18 (SUTURE) IMPLANT
SUT VIC AB 0 SH 27 (SUTURE) IMPLANT
SUT VIC AB 2-0 SH 27 (SUTURE)
SUT VIC AB 2-0 SH 27XBRD (SUTURE) IMPLANT
SUTURE FIBERWR #2 38 T-5 BLUE (SUTURE) IMPLANT
SYR BULB 3OZ (MISCELLANEOUS) IMPLANT
SYR CONTROL 10ML LL (SYRINGE) ×3 IMPLANT
TOWEL OR 17X24 6PK STRL BLUE (TOWEL DISPOSABLE) ×6 IMPLANT
TUBE CONNECTING 20'X1/4 (TUBING)
TUBE CONNECTING 20X1/4 (TUBING) IMPLANT
UNDERPAD 30X30 (UNDERPADS AND DIAPERS) ×3 IMPLANT

## 2016-01-12 NOTE — Anesthesia Procedure Notes (Signed)
Procedure Name: LMA Insertion Date/Time: 01/12/2016 2:46 PM Performed by: Maryella Shivers Pre-anesthesia Checklist: Patient identified, Emergency Drugs available, Suction available and Patient being monitored Patient Re-evaluated:Patient Re-evaluated prior to inductionOxygen Delivery Method: Circle System Utilized Preoxygenation: Pre-oxygenation with 100% oxygen Intubation Type: IV induction Ventilation: Mask ventilation without difficulty LMA: LMA inserted LMA Size: 4.0 Number of attempts: 1 Airway Equipment and Method: bite block Placement Confirmation: positive ETCO2 Tube secured with: Tape Dental Injury: Teeth and Oropharynx as per pre-operative assessment

## 2016-01-12 NOTE — H&P (Signed)
Laura Wall is an 55 y.o. female.   Chief Complaint: left forefoot pain HPI: 55 y/o female with left 3rd toe fracture about two weeks ago.  She has instability and loss of reduction of the fracture.  She presents now for closed reduction and pinning v. ORIF.   Past Medical History  Diagnosis Date  . Seasonal allergies   . History of kidney stones   . Ureteral stone pt states stone embedded in urethral sling  . Frequency of urination   . Urgency of urination   . SUI (stress urinary incontinence, female)   . Iron deficiency anemia   . Complication of anesthesia     woke up during some surgeries  . Toe fracture, left     3rd toe    Past Surgical History  Procedure Laterality Date  . Ectopic pregnancy surgery  2003    BILATERAL FIMBRIECTOMIES (TUBAL RUPTURE)  . Robotic-assisted total hysterecotmy w/ left ovarian cystectomy  01-14-2009  DR RIVARD    CHRONIC PELVIC PAIN/ UTERINE FIBROIDS/ ENDOMETRIOSIS/ ANEMIA  . Hysteroscopy w/  novasure endometrial ablation  2008    AND URETHRAL SLING PLACEMENT  . Cold knife cervical conization  1982  . Benign left breast lumpectomy  1985  . Tubal ligation  2003    BILATERAL  . Excision melanoma lesion right toe  2009  . Cystoscopy  08/29/2012    Procedure: CYSTOSCOPY;  Surgeon: Alexis Frock, MD;  Location[facility];  Service: Urology;  Laterality: N/A;  . Holmium laser application  92/10/1939    Procedure: HOLMIUM LASER APPLICATION;  Surgeon: Alexis Frock, MD;  Location: [facility];  Service: Urology;  Laterality: N/A;  There is no left or right designated because the stone is embedded in the urethral sling  . Colonoscopy    . Breast reduction surgery Bilateral 10/07/2014    Procedure: BILATERAL MAMMARY REDUCTION  (BREAST);  Surgeon: Theodoro Kos, DO;  Location: [place];  Service: Plastics;  Laterality: Bilateral;  . Liposuction Bilateral 10/07/2014    Procedure: LIPOSUCTION;  Surgeon:  Theodoro Kos, DO;  Location: [place];  Service: Plastics;  Laterality: Bilateral;  . Breast lumpectomy with radioactive seed localization Right 10/07/2014    Procedure: RIGHT BREAST LUMPECTOMY WITH RADIOACTIVE SEED LOCALIZATION;  Surgeon: Rolm Bookbinder, MD;  Location: [place];  Service: General;  Laterality: Right;  . Abdominal hysterectomy      Family History  Problem Relation Age of Onset  . Cancer Mother     multiple myeloma  . Cancer Father 48    lung  . Breast cancer Paternal Aunt   . Breast cancer Paternal Aunt   . Breast cancer Paternal Aunt    Social History:  reports that she quit smoking about 23 years ago. Her smoking use included Cigarettes. She has a 30 pack-year smoking history. She has never used smokeless tobacco. She reports that she drinks about 1.2 oz of alcohol per week. She reports that she does not use illicit drugs.  Allergies:  Allergies  Allergen Reactions  . Codeine Nausea And Vomiting  . Latex Rash  . Penicillins Rash  . Sulfa Antibiotics Rash    severe    Medications Prior to Admission  Medication Sig Dispense Refill  . FeFum-FePoly-FA-B Cmp-C-Biot (INTEGRA PLUS) CAPS Take 1 capsule by mouth daily. 30 capsule 0  . diazepam (VALIUM) 10 MG tablet Take 10 mg by mouth every 6 (six) hours as needed for anxiety.    Marland Kitchen  HYDROcodone-acetaminophen (NORCO) 5-325 MG tablet Take 1-2 tablets by mouth every 6 (six) hours as needed for moderate pain. 30 tablet 0    No results found for this or any previous visit (from the past 48 hour(s)). No results found.  ROS  No recent f/c/n/v/ wt loss  Blood pressure 130/69, pulse 77, temperature 98 F (36.7 C), temperature source Oral, resp. rate 20, height _0  (1.778 m), weight 81.194 kg (179 lb), SpO2 99 %. Physical Exam  wn wd woman in nad.  A and Ox 4.  Mood and affect normal.  EOMI.  resp unlabored.  L foot with healthy skin.  Sens to LT intact.  No lymphadenopathy.  Lateral  deviation of the left 3rd toe .  Brisk cap refill at the toes.    Assessment/Plan L 3rd toe displaced fracture - to OR for surgical treatment.  The risks and benefits of the alternative treatment options have been discussed in detail.  The patient wishes to proceed with surgery and specifically understands risks of bleeding, infection, nerve damage, blood clots, need for additional surgery, amputation and death.   Wylene Simmer, MD 01/31/2016, 2:22 PM

## 2016-01-12 NOTE — Discharge Instructions (Signed)
Wylene Simmer, MD [place]  Please read the following information regarding your care after surgery.  Medications  You only need a prescription for the narcotic pain medicine (ex. oxycodone, Percocet, Norco).  All of the other medicines listed below are available over the counter. X acetominophen (Tylenol) 650 mg every 4-6 hours as you need for minor pain X tramadol as prescribed for moderate to severe pain   Narcotic pain medicine (ex. oxycodone, Percocet, Vicodin) will cause constipation.  To prevent this problem, take the following medicines while you are taking any pain medicine. X docusate sodium (Colace) 100 mg twice a day X senna (Senokot) 2 tablets twice a day  Weight Bearing ? Bear weight when you are able on your operated leg or foot. X Bear weight on your operated foot in the post-op shoe. ? Do not bear any weight on the operated leg or foot.  Cast / Splint / Dressing Remove the ace bandage to shower as you like.  After your dressing, cast or splint is removed; you may shower, but do not soak or scrub the wound.  Allow the water to run over it, and then gently pat it dry.  Swelling It is normal for you to have swelling where you had surgery.  To reduce swelling and pain, keep your toes above your nose for at least 3 days after surgery.  It may be necessary to keep your foot or leg elevated for several weeks.  If it hurts, it should be elevated.  Follow Up Call my office at [phone] when you are discharged from the hospital or surgery center to schedule an appointment to be seen two weeks after surgery.  Call my office at [phone] if you develop a fever >101.5 F, nausea, vomiting, bleeding from the surgical site or severe pain.     Post Anesthesia Home Care Instructions  Activity: Get plenty of rest for the remainder of the day. A responsible adult should stay with you for 24 hours following the procedure.  For the next 24 hours, DO NOT: -Drive a  car -Paediatric nurse -Drink alcoholic beverages -Take any medication unless instructed by your physician -Make any legal decisions or sign important papers.  Meals: Start with liquid foods such as gelatin or soup. Progress to regular foods as tolerated. Avoid greasy, spicy, heavy foods. If nausea and/or vomiting occur, drink only clear liquids until the nausea and/or vomiting subsides. Call your physician if vomiting continues.  Special Instructions/Symptoms: Your throat may feel dry or sore from the anesthesia or the breathing tube placed in your throat during surgery. If this causes discomfort, gargle with warm salt water. The discomfort should disappear within 24 hours.  If you had a scopolamine patch placed behind your ear for the management of post- operative nausea and/or vomiting:  1. The medication in the patch is effective for 72 hours, after which it should be removed.  Wrap patch in a tissue and discard in the trash. Wash hands thoroughly with soap and water. 2. You may remove the patch earlier than 72 hours if you experience unpleasant side effects which may include dry mouth, dizziness or visual disturbances. 3. Avoid touching the patch. Wash your hands with soap and water after contact with the patch.

## 2016-01-12 NOTE — Brief Op Note (Signed)
01/12/2016  3:09 PM  PATIENT:  Laura Wall  55 y.o. female   PRE-OPERATIVE DIAGNOSIS:  LEFT THIRD TOE proximal phalanx FRACTURE   POST-OPERATIVE DIAGNOSIS:  LEFT THIRD TOE proximal phalanx FRACTURE   Procedure(s): 1.  Closed reduction and percutaneous pinning of left 3rd toe fracture   2.  AP and lateral xrays of the left fooot  SURGEON:  Wylene Simmer, MD  ASSISTANT: n/a  ANESTHESIA:   General  EBL:  minimal   TOURNIQUET:  none  COMPLICATIONS:  None apparent  DISPOSITION:  Extubated, awake and stable to recovery.  DICTATION ID:  [id number]

## 2016-01-12 NOTE — Transfer of Care (Signed)
Immediate Anesthesia Transfer of Care Note  Patient: Laura Wall  Procedure(s) Performed: Procedure(s): OPEN REDUCTION INTERNAL FIXATION (ORIF) LEFT THIRD TOE FRACTURE (Left)  Patient Location: PACU  Anesthesia Type:General  Level of Consciousness: sedated  Airway & Oxygen Therapy: Patient Spontanous Breathing and Patient connected to face mask oxygen  Post-op Assessment: Report given to RN and Post -op Vital signs reviewed and stable  Post vital signs: Reviewed and stable  Last Vitals:  Filed Vitals:   01/12/16 1246  BP: 130/69  Pulse: 77  Temp: 36.7 C  Resp: 20    Complications: No apparent anesthesia complications

## 2016-01-12 NOTE — Anesthesia Preprocedure Evaluation (Signed)
Anesthesia Evaluation  Patient identified by MRN, date of birth, ID band Patient awake    Reviewed: Allergy & Precautions, NPO status , Patient's Chart, lab work & pertinent test results  Airway Mallampati: I  TM Distance: >3 FB Neck ROM: Full    Dental  (+) Teeth Intact, Dental Advisory Given   Pulmonary former smoker,  breath sounds clear to auscultation        Cardiovascular Rhythm:Regular Rate:Normal     Neuro/Psych    GI/Hepatic   Endo/Other    Renal/GU      Musculoskeletal   Abdominal   Peds  Hematology   Anesthesia Other Findings   Reproductive/Obstetrics                             Anesthesia Physical Anesthesia Plan  ASA: I  Anesthesia Plan: General   Post-op Pain Management:    Induction: Intravenous  Airway Management Planned: LMA  Additional Equipment:   Intra-op Plan:   Post-operative Plan: Extubation in OR  Informed Consent: I have reviewed the patients History and Physical, chart, labs and discussed the procedure including the risks, benefits and alternatives for the proposed anesthesia with the patient or authorized representative who has indicated his/her understanding and acceptance.   Dental advisory given  Plan Discussed with: CRNA, Anesthesiologist and Surgeon  Anesthesia Plan Comments:        Anesthesia Quick Evaluation  

## 2016-01-13 ENCOUNTER — Encounter (HOSPITAL_BASED_OUTPATIENT_CLINIC_OR_DEPARTMENT_OTHER): Payer: Self-pay | Admitting: Orthopedic Surgery

## 2016-01-13 NOTE — Anesthesia Postprocedure Evaluation (Signed)
Anesthesia Post Note  Patient: Laura Wall  Procedure(s) Performed: Procedure(s) (LRB): OPEN REDUCTION INTERNAL FIXATION (ORIF) LEFT THIRD TOE FRACTURE (Left)  Patient location during evaluation: PACU Anesthesia Type: General Level of consciousness: awake and alert Pain management: pain level controlled Vital Signs Assessment: post-procedure vital signs reviewed and stable Respiratory status: spontaneous breathing, nonlabored ventilation and respiratory function stable Cardiovascular status: blood pressure returned to baseline and stable Postop Assessment: no signs of nausea or vomiting Anesthetic complications: no    Last Vitals:  Filed Vitals:   01/12/16 1530 01/12/16 1600  BP: 111/73 132/66  Pulse: 75 70  Temp:  36.4 C  Resp: 16 18    Last Pain:  Filed Vitals:   01/12/16 1609  PainSc: 0-No pain                 Elzena Muston A

## 2016-01-13 NOTE — Op Note (Signed)
NAME:  Laura Wall, Laura Wall NO.:  [id number]  MEDICAL RECORD NO.:  [id number]  LOCATION:                                 FACILITY:  PHYSICIAN:  Wylene Simmer, MD        DATE OF BIRTH:  [birth year]  DATE OF PROCEDURE:  01/12/2016 DATE OF DISCHARGE:                              OPERATIVE REPORT   PREOPERATIVE DIAGNOSIS:  Left third toe proximal phalanx fracture.  POSTOPERATIVE DIAGNOSIS:  Left third toe proximal phalanx fracture.  PROCEDURE: 1. Closed reduction and percutaneous pinning of the left 3rd toe     fracture. 2. AP and lateral radiographs of left foot.  SURGEON:  Wylene Simmer, MD  ANESTHESIA:  General.  ESTIMATED BLOOD LOSS:  Minimal.  TOURNIQUET TIME:  Zero.  COMPLICATIONS:  None apparent.  DISPOSITION:  Extubated, awake, and stable to recovery.  INDICATIONS FOR PROCEDURE:  The patient is a 55 year old woman who kicked a door facing injuring her left 3rd toe.  She initially had closed reduction and buddy taping, but lost reduction and has significant displacement of the 3rd toe deviating towards the lesser toes.  She presents now for closed reduction and pinning versus open reduction and internal fixation of the left 3rd toe fracture.  She understands the risks and benefits, the alternative treatment options, and elects surgical treatment.  She specifically understands risks of bleeding, infection, nerve damage, blood clots, need for additional surgery, continued pain, nonunion, amputation, and death.  PROCEDURE IN DETAIL:  After preoperative consent was obtained and the correct operative site was identified, the patient was brought to the operating room and placed supine on the operating table.  General anesthesia was induced.  Preoperative antibiotics were administered. Surgical time-out was taken.  Left lower extremity was prepped and draped in standard sterile fashion.  The 3rd toe was reduced.  A 0.054 K- wire was inserted through the tip of  the toe across the distal and middle phalanges.  The proximal phalanx fracture was held in a reduced position and the K-wire was advanced across the fracture site and into the proximal phalanx.  AP and lateral radiographs showed appropriate reduction of fracture.  K-wire was bent, trimmed, and capped.  Sterile dressings were applied.  0.5% Marcaine with epinephrine was infiltrated into the web space on either side of the 3rd toe for postoperative pain control.  The patient was awakened from anesthesia and transported to the recovery room in stable condition.  FOLLOWUP PLAN:  The patient be weightbearing as tolerated on the left foot in a flat postop shoe.  She will follow up with me in the office in 2 weeks for a wound check.  RADIOGRAPHS:  AP and lateral radiographs of the left foot were obtained intraoperatively.  These show interval reduction and percutaneous pinning of a 3rd toe proximal phalanx fracture.  Fracture sites appropriately reduced and the pins appropriately positioned and of the appropriate length.     Wylene Simmer, MD     JH/MEDQ  D:  01/12/2016  T:  01/12/2016  Job:  [id number]

## 2016-02-03 ENCOUNTER — Encounter (HOSPITAL_BASED_OUTPATIENT_CLINIC_OR_DEPARTMENT_OTHER): Payer: Self-pay | Admitting: Orthopedic Surgery

## 2016-02-15 MED FILL — FOLIVANE-PLUS CAPSULE: 30 days supply | Qty: 30 | Fill #0 | Status: TO

## 2016-04-09 MED FILL — CLINDAMYCIN HCL 150 MG CAP: 150 | 6 days supply | Qty: 28 | Fill #0

## 2016-04-09 MED FILL — FOLIVANE-PLUS CAPSULE: 30 days supply | Qty: 30 | Fill #0 | Status: TO

## 2016-05-10 MED FILL — diazePAM 10 MG TABS: 10 | 20 days supply | Qty: 20 | Fill #0

## 2016-06-20 ENCOUNTER — Encounter: Payer: Self-pay | Admitting: Gastroenterology

## 2016-06-20 MED FILL — FOLIVANE-PLUS CAPSULE: 30 days supply | Qty: 30 | Fill #0

## 2016-07-04 MED FILL — SCOPOLAMINE 1 MG/3 DAY PATC: 1 | 15 days supply | Qty: 5 | Fill #0

## 2016-07-04 MED FILL — VYVANSE 20 MG CAPSULE: 20 | 30 days supply | Qty: 30 | Fill #0

## 2016-08-07 DIAGNOSIS — L989 Disorder of the skin and subcutaneous tissue, unspecified: Secondary | ICD-10-CM | POA: Insufficient documentation

## 2016-08-20 ENCOUNTER — Encounter: Payer: Self-pay | Admitting: Gastroenterology

## 2016-08-29 DIAGNOSIS — J208 Acute bronchitis due to other specified organisms: Secondary | ICD-10-CM | POA: Diagnosis not present

## 2016-08-29 DIAGNOSIS — F988 Other specified behavioral and emotional disorders with onset usually occurring in childhood and adolescence: Secondary | ICD-10-CM | POA: Diagnosis not present

## 2016-08-29 MED FILL — BENZONATATE 100 MG CAPSULE: 100 | 7 days supply | Qty: 21 | Fill #0

## 2016-08-30 MED FILL — VYVANSE 20 MG CAPSULE: 20 | 30 days supply | Qty: 30 | Fill #0

## 2016-09-05 DIAGNOSIS — J208 Acute bronchitis due to other specified organisms: Secondary | ICD-10-CM | POA: Diagnosis not present

## 2016-09-05 MED FILL — predniSONE 10 MG TABS: 10 | 6 days supply | Qty: 21 | Fill #0

## 2016-09-05 MED FILL — AZITHROMYCIN 250 MG TABLET: 250 | 5 days supply | Qty: 6 | Fill #0

## 2016-09-05 MED FILL — VENTOLIN HFA 90 MCG INHALER: 108 (90 BAS | 17 days supply | Qty: 18 | Fill #0

## 2016-09-11 DIAGNOSIS — L821 Other seborrheic keratosis: Secondary | ICD-10-CM | POA: Diagnosis not present

## 2016-09-11 DIAGNOSIS — L989 Disorder of the skin and subcutaneous tissue, unspecified: Secondary | ICD-10-CM | POA: Diagnosis not present

## 2016-09-12 DIAGNOSIS — Z01419 Encounter for gynecological examination (general) (routine) without abnormal findings: Secondary | ICD-10-CM | POA: Diagnosis not present

## 2016-09-12 DIAGNOSIS — D649 Anemia, unspecified: Secondary | ICD-10-CM | POA: Diagnosis not present

## 2016-09-12 DIAGNOSIS — Z6826 Body mass index (BMI) 26.0-26.9, adult: Secondary | ICD-10-CM | POA: Diagnosis not present

## 2016-09-17 MED FILL — FOLIVANE-PLUS CAPSULE: 90 days supply | Qty: 90 | Fill #0

## 2016-10-11 DIAGNOSIS — Z1231 Encounter for screening mammogram for malignant neoplasm of breast: Secondary | ICD-10-CM | POA: Diagnosis not present

## 2016-10-23 ENCOUNTER — Ambulatory Visit: Payer: 59 | Admitting: *Deleted

## 2016-10-23 VITALS — Ht 70.0 in | Wt 181.2 lb

## 2016-10-23 DIAGNOSIS — Z86010 Personal history of colonic polyps: Secondary | ICD-10-CM

## 2016-10-23 MED ORDER — NA SULFATE-K SULFATE-MG SULF 17.5-3.13-1.6 GM/177ML PO SOLN: 1.0000 | Freq: Once | ORAL | 0 refills | Status: AC

## 2016-10-23 MED FILL — SUPREP BOWEL PREP KIT: 17.5-3.13-1 | 1 days supply | Qty: 354 | Fill #0

## 2016-10-23 NOTE — Progress Notes (Signed)
Denies allergies to eggs or soy products. Denies complications with sedation or anesthesia. Denies O2 use. Denies use of diet or weight loss medications.  Emmi instructions given for colonoscopy.  

## 2016-10-30 MED FILL — FAMCICLOVIR 500 MG TABLET: 500 | 3 days supply | Qty: 9 | Fill #0

## 2016-11-06 ENCOUNTER — Encounter: Payer: Self-pay | Admitting: Gastroenterology

## 2016-11-06 ENCOUNTER — Ambulatory Visit (AMBULATORY_SURGERY_CENTER): Payer: 59 | Admitting: Gastroenterology

## 2016-11-06 VITALS — BP 110/65 | HR 74 | Temp 97.3°F | Resp 18 | Ht 70.0 in | Wt 181.0 lb

## 2016-11-06 DIAGNOSIS — D123 Benign neoplasm of transverse colon: Secondary | ICD-10-CM

## 2016-11-06 DIAGNOSIS — K5730 Diverticulosis of large intestine without perforation or abscess without bleeding: Secondary | ICD-10-CM | POA: Diagnosis not present

## 2016-11-06 DIAGNOSIS — Z86010 Personal history of colonic polyps: Secondary | ICD-10-CM | POA: Diagnosis present

## 2016-11-06 DIAGNOSIS — D122 Benign neoplasm of ascending colon: Secondary | ICD-10-CM | POA: Diagnosis not present

## 2016-11-06 MED ORDER — SODIUM CHLORIDE 0.9 % IV SOLN: 500.0000 mL | INTRAVENOUS | Status: DC

## 2016-11-06 NOTE — Op Note (Signed)
[facility] Patient Name: Laura Wall Procedure Date: 11/06/2016 8:08 AM MRN: [id number] Endoscopist: Milus Banister , MD Age: 56 Referring MD:  Date of Birth: 1960/11/05 Gender: Female Account #: [id number] Procedure:                Colonoscopy Indications:              High risk colon cancer surveillance: Personal                            history of colonic polyps; colonoscopy 2014 Dr.                            Ardis Hughs 1.1cm TVA Medicines:                Monitored Anesthesia Care Procedure:                Pre-Anesthesia Assessment:                           - Prior to the procedure, a History and Physical                            was performed, and patient medications and                            allergies were reviewed. The patient's tolerance of                            previous anesthesia was also reviewed. The risks                            and benefits of the procedure and the sedation                            options and risks were discussed with the patient.                            All questions were answered, and informed consent                            was obtained. Prior Anticoagulants: The patient has                            taken no previous anticoagulant or antiplatelet                            agents. ASA Grade Assessment: II - A patient with                            mild systemic disease. After reviewing the risks                            and benefits, the patient was deemed in  satisfactory condition to undergo the procedure.                           After obtaining informed consent, the colonoscope                            was passed under direct vision. Throughout the                            procedure, the patient's blood pressure, pulse, and                            oxygen saturations were monitored continuously. The                            Model CF-HQ190L [id number]) scope was introduced                             through the anus and advanced to the the cecum,                            identified by appendiceal orifice and ileocecal                            valve. The colonoscopy was performed without                            difficulty. The patient tolerated the procedure                            well. The quality of the bowel preparation was                            excellent. The ileocecal valve, appendiceal                            orifice, and rectum were photographed. Scope In: 8:12:44 AM Scope Out: 8:25:14 AM Scope Withdrawal Time: 0 hours 10 minutes 7 seconds  Total Procedure Duration: 0 hours 12 minutes 30 seconds  Findings:                 Two sessile polyps were found in the transverse                            colon and ascending colon. The polyps were 3 to 6                            mm in size. These polyps were removed with a cold                            snare. Resection and retrieval were complete.                           Multiple small and large-mouthed diverticula were  found in the left colon.                           The exam was otherwise without abnormality on                            direct and retroflexion views. Complications:            No immediate complications. Estimated blood loss:                            None. Estimated Blood Loss:     Estimated blood loss: none. Impression:               - Two 3 to 6 mm polyps in the transverse colon and                            in the ascending colon, removed with a cold snare.                            Resected and retrieved.                           - Diverticulosis in the left colon.                           - The examination was otherwise normal on direct                            and retroflexion views. Recommendation:           - Patient has a contact number available for                            emergencies. The signs and symptoms of  potential                            delayed complications were discussed with the                            patient. Return to normal activities tomorrow.                            Written discharge instructions were provided to the                            patient.                           - Resume previous diet.                           - Continue present medications.                           You will receive a letter within 2-3 weeks with the  pathology results and my final recommendations.                           If the polyp(s) is proven to be 'pre-cancerous' on                            pathology, you will need repeat colonoscopy in 5                            years. Milus Banister, MD 11/06/2016 8:28:03 AM This report has been signed electronically.

## 2016-11-06 NOTE — Progress Notes (Signed)
Report to PACU, RN, vss, BBS= Clear.  

## 2016-11-06 NOTE — Patient Instructions (Signed)
YOU HAD AN ENDOSCOPIC PROCEDURE TODAY AT [facility]:   Refer to the procedure report that was given to you for any specific questions about what was found during the examination.  If the procedure report does not answer your questions, please call your gastroenterologist to clarify.  If you requested that your care partner not be given the details of your procedure findings, then the procedure report has been included in a sealed envelope for you to review at your convenience later.  YOU SHOULD EXPECT: Some feelings of bloating in the abdomen. Passage of more gas than usual.  Walking can help get rid of the air that was put into your GI tract during the procedure and reduce the bloating. If you had a lower endoscopy (such as a colonoscopy or flexible sigmoidoscopy) you may notice spotting of blood in your stool or on the toilet paper. If you underwent a bowel prep for your procedure, you may not have a normal bowel movement for a few days.  Please Note:  You might notice some irritation and congestion in your nose or some drainage.  This is from the oxygen used during your procedure.  There is no need for concern and it should clear up in a day or so.  SYMPTOMS TO REPORT IMMEDIATELY:   Following lower endoscopy (colonoscopy or flexible sigmoidoscopy):  Excessive amounts of blood in the stool  Significant tenderness or worsening of abdominal pains  Swelling of the abdomen that is new, acute  Fever of 100F or higher   For urgent or emergent issues, a gastroenterologist can be reached at any hour by calling [phone].   DIET:  We do recommend a small meal at first, but then you may proceed to your regular diet.  Drink plenty of fluids but you should avoid alcoholic beverages for 24 hours. Try to increase the fiber in your diet, and drink plenty of water.  ACTIVITY:  You should plan to take it easy for the rest of today and you should NOT DRIVE or use heavy machinery until  tomorrow (because of the sedation medicines used during the test).    FOLLOW UP: Our staff will call the number listed on your records the next business day following your procedure to check on you and address any questions or concerns that you may have regarding the information given to you following your procedure. If we do not reach you, we will leave a message.  However, if you are feeling well and you are not experiencing any problems, there is no need to return our call.  We will assume that you have returned to your regular daily activities without incident.  If any biopsies were taken you will be contacted by phone or by letter within the next 1-3 weeks.  Please call us at [phone] if you have not heard about the biopsies in 3 weeks.    SIGNATURES/CONFIDENTIALITY: You and/or your care partner have signed paperwork which will be entered into your electronic medical record.  These signatures attest to the fact that that the information above on your After Visit Summary has been reviewed and is understood.  Full responsibility of the confidentiality of this discharge information lies with you and/or your care-partner.  Read all of the handouts given to you by your recovery room nurse.  Thank-you for choosing [facility] for healthcare needs today.

## 2016-11-06 NOTE — Progress Notes (Signed)
Called to room to assist during endoscopic procedure.  Patient ID and intended procedure confirmed with present staff. Received instructions for my participation in the procedure from the performing physician.  

## 2016-11-07 ENCOUNTER — Telehealth: Payer: Self-pay

## 2016-11-07 NOTE — Telephone Encounter (Signed)
  Follow up Call-  Call back number 11/06/2016  Post procedure Call Back phone  # [phone]  Permission to leave phone message Yes  Some recent data might be hidden     Patient questions:  Do you have a fever, pain , or abdominal swelling? No. Pain Score  0 *  Have you tolerated food without any problems? Yes.    Have you been able to return to your normal activities? Yes.    Do you have any questions about your discharge instructions: Diet   No. Medications  No. Follow up visit  No.  Do you have questions or concerns about your Care? No.  Actions: * If pain score is 4 or above: No action needed, pain <4.

## 2016-11-13 ENCOUNTER — Encounter: Payer: Self-pay | Admitting: Gastroenterology

## 2017-02-26 MED FILL — FOLIVANE-PLUS CAPSULE: 90 days supply | Qty: 90 | Fill #1

## 2017-03-27 DIAGNOSIS — H524 Presbyopia: Secondary | ICD-10-CM | POA: Diagnosis not present

## 2017-03-27 DIAGNOSIS — H52223 Regular astigmatism, bilateral: Secondary | ICD-10-CM | POA: Diagnosis not present

## 2017-03-27 DIAGNOSIS — H5203 Hypermetropia, bilateral: Secondary | ICD-10-CM | POA: Diagnosis not present

## 2017-04-08 DIAGNOSIS — N898 Other specified noninflammatory disorders of vagina: Secondary | ICD-10-CM | POA: Diagnosis not present

## 2017-04-08 DIAGNOSIS — R102 Pelvic and perineal pain: Secondary | ICD-10-CM | POA: Diagnosis not present

## 2017-04-08 MED FILL — TRIAMCINOLONE 0.1% OINTMENT: 0.1 | 7 days supply | Qty: 30 | Fill #0

## 2017-06-03 DIAGNOSIS — Z0000 Encounter for general adult medical examination without abnormal findings: Secondary | ICD-10-CM | POA: Diagnosis not present

## 2017-06-03 DIAGNOSIS — F988 Other specified behavioral and emotional disorders with onset usually occurring in childhood and adolescence: Secondary | ICD-10-CM | POA: Diagnosis not present

## 2017-06-03 DIAGNOSIS — R42 Dizziness and giddiness: Secondary | ICD-10-CM | POA: Diagnosis not present

## 2017-06-03 DIAGNOSIS — M19049 Primary osteoarthritis, unspecified hand: Secondary | ICD-10-CM | POA: Diagnosis not present

## 2017-06-21 MED FILL — ADDERALL XR 20 MG CAP SA: 20 | 30 days supply | Qty: 30 | Fill #0

## 2017-06-24 MED FILL — diazePAM 10 MG TABS: 10 | 20 days supply | Qty: 20 | Fill #0

## 2017-08-06 DIAGNOSIS — D2272 Melanocytic nevi of left lower limb, including hip: Secondary | ICD-10-CM | POA: Diagnosis not present

## 2017-08-06 DIAGNOSIS — Z23 Encounter for immunization: Secondary | ICD-10-CM | POA: Diagnosis not present

## 2017-08-06 DIAGNOSIS — D225 Melanocytic nevi of trunk: Secondary | ICD-10-CM | POA: Diagnosis not present

## 2017-08-06 DIAGNOSIS — D2230 Melanocytic nevi of unspecified part of face: Secondary | ICD-10-CM | POA: Diagnosis not present

## 2017-08-06 DIAGNOSIS — Z86018 Personal history of other benign neoplasm: Secondary | ICD-10-CM | POA: Diagnosis not present

## 2017-08-06 DIAGNOSIS — Z85828 Personal history of other malignant neoplasm of skin: Secondary | ICD-10-CM | POA: Diagnosis not present

## 2017-08-06 DIAGNOSIS — D1800 Hemangioma unspecified site: Secondary | ICD-10-CM | POA: Diagnosis not present

## 2017-08-06 DIAGNOSIS — D224 Melanocytic nevi of scalp and neck: Secondary | ICD-10-CM | POA: Diagnosis not present

## 2017-08-06 DIAGNOSIS — L821 Other seborrheic keratosis: Secondary | ICD-10-CM | POA: Diagnosis not present

## 2017-09-23 DIAGNOSIS — M19049 Primary osteoarthritis, unspecified hand: Secondary | ICD-10-CM | POA: Diagnosis not present

## 2017-09-23 MED FILL — MELOXICAM 7.5 MG TABLET: 7.5 | 30 days supply | Qty: 30 | Fill #0

## 2017-09-23 MED FILL — DICLOFENAC SODIUM 1% GEL: 1 | 15 days supply | Qty: 100 | Fill #0

## 2017-10-11 DIAGNOSIS — H6012 Cellulitis of left external ear: Secondary | ICD-10-CM | POA: Diagnosis not present

## 2017-10-11 DIAGNOSIS — R5383 Other fatigue: Secondary | ICD-10-CM | POA: Diagnosis not present

## 2017-10-11 MED FILL — DOXYCYCLINE MONO 100 MG TAB: 100 | 10 days supply | Qty: 20 | Fill #0

## 2017-10-11 MED FILL — FOLIVANE-PLUS CAPSULE: 30 days supply | Qty: 30 | Fill #0

## 2017-12-02 DIAGNOSIS — M79641 Pain in right hand: Secondary | ICD-10-CM | POA: Diagnosis not present

## 2017-12-02 DIAGNOSIS — M2550 Pain in unspecified joint: Secondary | ICD-10-CM | POA: Diagnosis not present

## 2017-12-02 DIAGNOSIS — M79642 Pain in left hand: Secondary | ICD-10-CM | POA: Diagnosis not present

## 2017-12-02 DIAGNOSIS — R5382 Chronic fatigue, unspecified: Secondary | ICD-10-CM | POA: Diagnosis not present

## 2017-12-02 DIAGNOSIS — E663 Overweight: Secondary | ICD-10-CM | POA: Diagnosis not present

## 2017-12-02 DIAGNOSIS — M150 Primary generalized (osteo)arthritis: Secondary | ICD-10-CM | POA: Diagnosis not present

## 2017-12-02 DIAGNOSIS — Z6825 Body mass index (BMI) 25.0-25.9, adult: Secondary | ICD-10-CM | POA: Diagnosis not present

## 2017-12-02 MED FILL — MELOXICAM 15 MG TABLET: 15 | 30 days supply | Qty: 30 | Fill #0

## 2018-01-06 MED FILL — MELOXICAM 15 MG TABLET: 15 | 30 days supply | Qty: 30 | Fill #1

## 2018-01-06 MED FILL — FOLIVANE-PLUS CAPSULE: 30 days supply | Qty: 30 | Fill #1

## 2018-02-07 DIAGNOSIS — S90211A Contusion of right great toe with damage to nail, initial encounter: Secondary | ICD-10-CM | POA: Diagnosis not present

## 2018-02-07 DIAGNOSIS — H1013 Acute atopic conjunctivitis, bilateral: Secondary | ICD-10-CM | POA: Diagnosis not present

## 2018-02-07 MED FILL — ERYTHROMYCIN 0.5% EYE OINT: 5 | 5 days supply | Qty: 3 | Fill #0

## 2018-02-24 DIAGNOSIS — Z1231 Encounter for screening mammogram for malignant neoplasm of breast: Secondary | ICD-10-CM | POA: Diagnosis not present

## 2018-02-24 DIAGNOSIS — Z803 Family history of malignant neoplasm of breast: Secondary | ICD-10-CM | POA: Diagnosis not present

## 2018-03-07 MED FILL — MELOXICAM 15 MG TABLET: 15 | 30 days supply | Qty: 30 | Fill #2

## 2018-03-07 MED FILL — FOLIVANE-PLUS CAPSULE: 30 days supply | Qty: 30 | Fill #2

## 2018-03-10 MED FILL — HYDROCODON-APAP 5-325: 5-325 | 7 days supply | Qty: 30 | Fill #0

## 2018-04-29 MED FILL — FOLIVANE-PLUS CAPSULE: 30 days supply | Qty: 30 | Fill #3

## 2018-04-30 MED FILL — MELOXICAM 15 MG TABLET: 15 | 30 days supply | Qty: 30 | Fill #0

## 2018-06-12 DIAGNOSIS — M19049 Primary osteoarthritis, unspecified hand: Secondary | ICD-10-CM | POA: Diagnosis not present

## 2018-06-12 DIAGNOSIS — R42 Dizziness and giddiness: Secondary | ICD-10-CM | POA: Diagnosis not present

## 2018-06-12 DIAGNOSIS — Z0000 Encounter for general adult medical examination without abnormal findings: Secondary | ICD-10-CM | POA: Diagnosis not present

## 2018-06-12 DIAGNOSIS — F988 Other specified behavioral and emotional disorders with onset usually occurring in childhood and adolescence: Secondary | ICD-10-CM | POA: Diagnosis not present

## 2018-06-12 MED FILL — MELOXICAM 15 MG TABLET: 15 | 90 days supply | Qty: 90 | Fill #0

## 2018-06-12 MED FILL — diazePAM 10 MG TABS: 10 | 90 days supply | Qty: 30 | Fill #0

## 2018-06-12 MED FILL — FOLIVANE-PLUS CAPSULE: 90 days supply | Qty: 90 | Fill #0

## 2018-06-26 ENCOUNTER — Encounter: Payer: Self-pay | Admitting: Podiatry

## 2018-06-26 ENCOUNTER — Ambulatory Visit: Payer: 59 | Admitting: Podiatry

## 2018-06-26 DIAGNOSIS — C439 Malignant melanoma of skin, unspecified: Secondary | ICD-10-CM | POA: Insufficient documentation

## 2018-06-26 DIAGNOSIS — B351 Tinea unguium: Secondary | ICD-10-CM | POA: Diagnosis not present

## 2018-06-26 DIAGNOSIS — L603 Nail dystrophy: Secondary | ICD-10-CM | POA: Diagnosis not present

## 2018-06-26 NOTE — Progress Notes (Signed)
Subjective:   Patient ID: Laura Wall, female   DOB: 57 y.o.   MRN: [id number]   HPI Ms. Hokenson presents to the office today for concerns of right toenail discoloration and thickening.  This is been an issue for the last several months and she is seen her primary care physician twice for this.  She says it started initially when she is getting a pedicure and a dog under the toenail and there was a dark spot formed.  She also states that she recently fell and injured her left foot and when she fell she may benefit the right toenail out.  She was shown the pictures and how it started off as a small dark area in the middle the toenail and then since the 2 insults to her nails the entire nails become thick and dark in color and the nails trimmed to the attachment underlying skin.  No significant discomfort she denies any redness or drainage or any swelling.  She has no other concerns today.   Review of Systems  All other systems reviewed and are negative.  Past Medical History:  Diagnosis Date  . Complication of anesthesia    woke up during some surgeries  . Frequency of urination   . History of kidney stones   . Iron deficiency anemia   . Seasonal allergies   . SUI (stress urinary incontinence, female)   . Toe fracture, left    3rd toe  . Ureteral stone pt states stone embedded in urethral sling  . Urgency of urination     Past Surgical History:  Procedure Laterality Date  . ABDOMINAL HYSTERECTOMY    . BENIGN LEFT BREAST LUMPECTOMY  1985  . BREAST LUMPECTOMY WITH RADIOACTIVE SEED LOCALIZATION Right 10/07/2014   Procedure: RIGHT BREAST LUMPECTOMY WITH RADIOACTIVE SEED LOCALIZATION;  Surgeon: Rolm Bookbinder, MD;  Location: [facility];  Service: General;  Laterality: Right;  . BREAST RECONSTRUCTION Bilateral 09/2014  . BREAST REDUCTION SURGERY Bilateral 10/07/2014   Procedure: BILATERAL MAMMARY REDUCTION  (BREAST);  Surgeon: Theodoro Kos, DO;  Location: [facility];  Service: Plastics;  Laterality: Bilateral;  . COLD KNIFE CERVICAL CONIZATION  1982  . COLONOSCOPY    . CYSTOSCOPY  08/29/2012   Procedure: CYSTOSCOPY;  Surgeon: Alexis Frock, MD;  Location: [facility];  Service: Urology;  Laterality: N/A;  . ECTOPIC PREGNANCY SURGERY  2003   BILATERAL FIMBRIECTOMIES (TUBAL RUPTURE)  . EXCISION MELANOMA LESION RIGHT TOE  2009  . HOLMIUM LASER APPLICATION  61/01/4314   Procedure: HOLMIUM LASER APPLICATION;  Surgeon: Alexis Frock, MD;  Location: [facility];  Service: Urology;  Laterality: N/A;  There is no left or right designated because the stone is embedded in the urethral sling  . HYSTEROSCOPY W/  NOVASURE ENDOMETRIAL ABLATION  2008   AND URETHRAL SLING PLACEMENT  . LIPOSUCTION Bilateral 10/07/2014   Procedure: LIPOSUCTION;  Surgeon: Theodoro Kos, DO;  Location: [facility];  Service: Plastics;  Laterality: Bilateral;  . ORIF TOE FRACTURE Left 01/12/2016   Procedure: OPEN REDUCTION INTERNAL FIXATION (ORIF) LEFT THIRD TOE FRACTURE;  Surgeon: Wylene Simmer, MD;  Location: [facility];  Service: Orthopedics;  Laterality: Left;  . ROBOTIC-ASSISTED TOTAL HYSTERECOTMY W/ LEFT OVARIAN CYSTECTOMY  01-14-2009  DR RIVARD   CHRONIC PELVIC PAIN/ UTERINE FIBROIDS/ ENDOMETRIOSIS/ ANEMIA  . TUBAL LIGATION  2003   BILATERAL     Current Outpatient Medications:  .  meloxicam (MOBIC) 15 MG tablet,  TAKE 1 TABLET BY MOUTH ONCE DAILY FOR 10 DAYS, THEN ONCE A DAY IF NEEDED FOR JOINT SWELLING, Disp: , Rfl:  .  diazepam (VALIUM) 10 MG tablet, Take 10 mg by mouth every 6 (six) hours as needed for anxiety., Disp: , Rfl:  .  FeFum-FePoly-FA-B Cmp-C-Biot (INTEGRA PLUS) CAPS, Take 1 capsule by mouth daily., Disp: 30 capsule, Rfl: 0  Current Facility-Administered Medications:  .  0.9 %  sodium chloride infusion, 500 mL, Intravenous, Continuous, Milus Banister, MD  Allergies  Allergen Reactions  .  Codeine Nausea And Vomiting  . Latex Rash  . Penicillins Rash  . Sulfa Antibiotics Rash    severe         Objective:  Physical Exam  General: AAO x3, NAD  Dermatological: Walking boot intact to the left foot so cannot visualize his nails.  The right foot she has no polyps that she did not want removed however I could tell the nail is somewhat thickened and have some yellow to brown discoloration is loose and underlying nail bed.  There is no pain in the nails there is no swelling redness or drainage.  No open lesions.  Vascular: Dorsalis Pedis artery and Posterior Tibial artery pedal pulses are 2/4 bilateral with immedate capillary fill time. There is no pain with calf compression, swelling, warmth, erythema.   Neruologic: Grossly intact via light touch bilateral.  Protective threshold with Semmes Wienstein monofilament intact to all pedal sites bilateral.   Musculoskeletal: No gross boney pedal deformities bilateral. No pain, crepitus, or limitation noted with foot and ankle range of motion bilateral. Muscular strength 5/5 in all groups tested bilateral.  Gait: Unassisted, Nonantalgic.      Assessment:   Onychodystrophy, onychomycosis right hallux     Plan:  -Treatment options discussed including all alternatives, risks, and complications -Etiology of symptoms were discussed -Today we discussed various treatment options.  Today I did debride the toenail I sent this for culture/pathology to [facility] labs.  After getting this back we discussed further treatment options.  Discussed oral, topical, laser.  I will call her with results.  Trula Slade DPM

## 2018-06-26 NOTE — Progress Notes (Signed)
Patient ID: Laura Wall, female   DOB: 1960-11-30, 57 y.o.   MRN: [id number]

## 2018-07-08 ENCOUNTER — Ambulatory Visit: Payer: 59 | Admitting: Podiatry

## 2018-07-08 ENCOUNTER — Encounter: Payer: Self-pay | Admitting: Podiatry

## 2018-07-08 ENCOUNTER — Encounter

## 2018-07-08 DIAGNOSIS — B351 Tinea unguium: Secondary | ICD-10-CM

## 2018-07-08 DIAGNOSIS — S99921A Unspecified injury of right foot, initial encounter: Secondary | ICD-10-CM | POA: Diagnosis not present

## 2018-07-08 DIAGNOSIS — L603 Nail dystrophy: Secondary | ICD-10-CM | POA: Diagnosis not present

## 2018-07-08 NOTE — Patient Instructions (Signed)

## 2018-07-09 NOTE — Progress Notes (Signed)
Subjective: 57 year old female presents the office today for concerns of toenail injury to her right big toe.  Since I last saw her she did hit her toe on a piece of furniture in her toenail starting to lift up.  She states that she has not been able to wear shoe because of the discomfort.  Denies any drainage or pus.  She has no other concerns. Denies any systemic complaints such as fevers, chills, nausea, vomiting. No acute changes since last appointment, and no other complaints at this time.   Objective: AAO x3, NAD DP/PT pulses palpable bilaterally, CRT less than 3 seconds Right hallux toenail is loose and underlying nail bed there is discoloration of the nail although there is polish present in the nail.  There is tenderness the entire toenail but there is no surrounding erythema, ascending cellulitis.  There is localized edema to the nail corners. Left hallux toenail starting to have discoloration with brown discoloration she showed me pictures of this although it is mild. No open lesions or pre-ulcerative lesions.  No pain with calf compression, swelling, warmth, erythema  Assessment: 57 year old female right hallux onycholysis, onychomycosis  Plan: -All treatment options discussed with the patient including all alternatives, risks, complications.  -Today to splinter injury to the toenail as well as her culture results showing fungus I do recommend toenail removal of this only attached at the base given the pain.  We discussed alternatives, risks, complications.  The toe was anesthetized with a mixture of lidocaine and Marcaine plain for total of 3 cc.  Once the toe was anesthetized the skin was prepped in sterile fashion and tourniquet was applied.  The right hallux toenail was removed in total without any complications and hemostasis achieved and the area was flushed with alcohol.  Betadine gel was applied followed dry sterile dressing.  Tourniquet was released and was found to be an  immediate cap refill time to the toe. -Given the fungus we discussed options.  Beta-blocker and start oral therapy to get results of her primary care physician.  Also she has used topical.  We will do this today through [employer].  Discussed side effects of these medications as well as success rates and instructions on use. -Patient encouraged to call the office with any questions, concerns, change in symptoms.   Trula Slade DPM

## 2018-07-10 ENCOUNTER — Other Ambulatory Visit: Payer: Self-pay | Admitting: Podiatry

## 2018-07-10 MED ORDER — FLUCONAZOLE 150 MG PO TABS: 150.0000 mg | ORAL_TABLET | ORAL | 0 refills | Status: DC

## 2018-07-10 MED FILL — FLUCONAZOLE 150 MG TABS: 150 | 84 days supply | Qty: 12 | Fill #0

## 2018-07-10 NOTE — Progress Notes (Signed)
Fluconazole ordered due to yeast infection in toenail. Cranford Mon, CMA called her to discuss.

## 2018-07-15 ENCOUNTER — Ambulatory Visit: Payer: 59

## 2018-07-15 DIAGNOSIS — L603 Nail dystrophy: Secondary | ICD-10-CM

## 2018-07-15 NOTE — Patient Instructions (Signed)

## 2018-07-16 NOTE — Addendum Note (Signed)
Addended by: Cranford Mon R on: 07/16/2018 11:34 AM   Modules accepted: Orders

## 2018-07-18 NOTE — Progress Notes (Signed)
Patient is here today for follow-up appointment recent procedure performed on 07/08/2018, removal of right hallux nail.  She states that overall is doing well, she is still soaking, the pain is very minimal but it is still sore.  No erythema, no redness, no drainage, no other signs and symptoms of infection.  There is indication of trauma to the nailbed, with a dark red spot in the middle of the nail from previous injury.  Otherwise area appears to be healing well.  Discussed signs and symptoms of infection, verbal and written instructions were given to patient.  She did express concern about the fifth toenail on her right foot being dark/discolored.  This appears to be happening due to her shoe rubbing on the nail polish, but I did tell her that when she remove the polish if the nail was still dark she can call get an appointment to see [place] we can prescribe a topical nail lacquer.  She is to continue soaking, continue bandaging, bandage off at night.  She is to follow-up with any acute symptom type changes or questions or concerns.

## 2018-08-05 ENCOUNTER — Other Ambulatory Visit (HOSPITAL_COMMUNITY): Payer: Self-pay | Admitting: Orthopedic Surgery

## 2018-08-05 DIAGNOSIS — M79672 Pain in left foot: Secondary | ICD-10-CM

## 2018-08-07 DIAGNOSIS — Z85828 Personal history of other malignant neoplasm of skin: Secondary | ICD-10-CM | POA: Diagnosis not present

## 2018-08-07 DIAGNOSIS — D2230 Melanocytic nevi of unspecified part of face: Secondary | ICD-10-CM | POA: Diagnosis not present

## 2018-08-07 DIAGNOSIS — L820 Inflamed seborrheic keratosis: Secondary | ICD-10-CM | POA: Diagnosis not present

## 2018-08-07 DIAGNOSIS — D2272 Melanocytic nevi of left lower limb, including hip: Secondary | ICD-10-CM | POA: Diagnosis not present

## 2018-08-07 DIAGNOSIS — D1800 Hemangioma unspecified site: Secondary | ICD-10-CM | POA: Diagnosis not present

## 2018-08-07 DIAGNOSIS — Z86018 Personal history of other benign neoplasm: Secondary | ICD-10-CM | POA: Diagnosis not present

## 2018-08-07 DIAGNOSIS — Z23 Encounter for immunization: Secondary | ICD-10-CM | POA: Diagnosis not present

## 2018-08-07 DIAGNOSIS — D224 Melanocytic nevi of scalp and neck: Secondary | ICD-10-CM | POA: Diagnosis not present

## 2018-08-08 ENCOUNTER — Ambulatory Visit (HOSPITAL_COMMUNITY): Payer: 59

## 2018-08-08 ENCOUNTER — Encounter (HOSPITAL_COMMUNITY): Payer: Self-pay

## 2018-08-08 ENCOUNTER — Ambulatory Visit (HOSPITAL_COMMUNITY)
Admission: RE | Admit: 2018-08-08 | Discharge: 2018-08-08 | Disposition: A | Discharge status: Home / Self Care | Payer: PRIVATE HEALTH INSURANCE | Source: Ambulatory Visit | Attending: Orthopedic Surgery | Admitting: Orthopedic Surgery

## 2018-08-08 DIAGNOSIS — X58XXXA Exposure to other specified factors, initial encounter: Secondary | ICD-10-CM | POA: Insufficient documentation

## 2018-08-08 DIAGNOSIS — M79672 Pain in left foot: Secondary | ICD-10-CM | POA: Diagnosis present

## 2018-08-08 DIAGNOSIS — S92352A Displaced fracture of fifth metatarsal bone, left foot, initial encounter for closed fracture: Secondary | ICD-10-CM | POA: Insufficient documentation

## 2018-11-11 ENCOUNTER — Encounter: Payer: Self-pay | Admitting: Plastic Surgery

## 2018-11-14 ENCOUNTER — Ambulatory Visit (INDEPENDENT_AMBULATORY_CARE_PROVIDER_SITE_OTHER): Payer: Self-pay | Admitting: Plastic Surgery

## 2018-11-14 ENCOUNTER — Encounter: Payer: Self-pay | Admitting: Plastic Surgery

## 2018-11-14 VITALS — BP 126/84 | HR 63 | Temp 97.9°F | Ht 70.0 in | Wt 180.0 lb

## 2018-11-14 DIAGNOSIS — Z719 Counseling, unspecified: Secondary | ICD-10-CM

## 2018-11-14 NOTE — Progress Notes (Signed)
Botulinum Toxin Procedure Note  Procedure: Cosmetic botulinum toxin  Pre-operative Diagnosis: Dynamic rhytides   Post-operative Diagnosis: Same  Complications:  None  Brief history: The patient desires botulinum toxin injection of her forehead. I discussed with the patient this proposed procedure of botulinum toxin injections, which is customized depending on the particular needs of the patient. It is performed on facial rhytids as a temporary correction. The alternatives were discussed with the patient. The risks were addressed including bleeding, scarring, infection, damage to deeper structures, asymmetry, and chronic pain, which may occur infrequently after a procedure. The individual's choice to undergo a surgical procedure is based on the comparison of risks to potential benefits. Other risks include unsatisfactory results, brow ptosis, eyelid ptosis, allergic reaction, temporary paralysis, which should go away with time, bruising, blurring disturbances and delayed healing. Botulinum toxin injections do not arrest the aging process or produce permanent tightening of the eyelid.  Operative intervention maybe necessary to maintain the results of a blepharoplasty or botulinum toxin. The patient understands and wishes to proceed. An informed consent was signed and informational brochures given to her prior to the procedure.  Procedure: The area was prepped with alcohol and dried with a clean gauze. Using a clean technique, the botulinum toxin was diluted with 1.25 cc of preservative-free normal saline which was slowly injected with an 18 gauge needle in a tuberculin syringes.  A 32 gauge needles were then used to inject the botulinum toxin. This mixture allow for an aliquot of 5 units per 0.1 cc in each injection site.    Subsequently the mixture was injected in the glabellar and forehead area with preservation of the temporal branch to the lateral eyebrow as well as into each lateral canthal area  beginning from the lateral orbital rim medial to the zygomaticus major in 3 separate areas. A total of 30 Units of botulinum toxin was used. The forehead and glabellar area was injected with care to inject intramuscular only while holding pressure on the supratrochlear vessels in each area during each injection on either side of the medial corrugators. The injection proceeded vertically superiorly to the medial 2/3 of the frontalis muscle and superior 2/3 of the lateral frontalis, again with preservation of the frontal branch.  No complications were noted. Light pressure was held for 5 minutes. She was instructed explicitly in post-operative care.  Botox LOT:  [id number] EXP:  5/22  

## 2018-12-08 MED FILL — FOLIVANE-PLUS CAPSULE: 90 days supply | Qty: 90 | Fill #1

## 2018-12-08 MED FILL — MELOXICAM 15 MG TABLET: 15 | 30 days supply | Qty: 30 | Fill #1 | Status: TO

## 2019-01-14 DIAGNOSIS — H52223 Regular astigmatism, bilateral: Secondary | ICD-10-CM | POA: Diagnosis not present

## 2019-01-14 DIAGNOSIS — H524 Presbyopia: Secondary | ICD-10-CM | POA: Diagnosis not present

## 2019-01-14 DIAGNOSIS — H5203 Hypermetropia, bilateral: Secondary | ICD-10-CM | POA: Diagnosis not present

## 2019-01-23 MED FILL — MELOXICAM 15 MG TABLET: 15 | 30 days supply | Qty: 30 | Fill #0 | Status: TO

## 2019-03-24 MED FILL — FOLIVANE-PLUS CAPSULE: 90 days supply | Qty: 90 | Fill #2

## 2019-03-25 DIAGNOSIS — Z1231 Encounter for screening mammogram for malignant neoplasm of breast: Secondary | ICD-10-CM | POA: Diagnosis not present

## 2019-04-06 MED FILL — MELOXICAM 15 MG TABLET: 15 | 30 days supply | Qty: 30 | Fill #0

## 2019-05-28 ENCOUNTER — Encounter: Payer: Self-pay | Admitting: Plastic Surgery

## 2019-05-28 ENCOUNTER — Other Ambulatory Visit: Payer: Self-pay

## 2019-05-28 ENCOUNTER — Ambulatory Visit (INDEPENDENT_AMBULATORY_CARE_PROVIDER_SITE_OTHER): Payer: Self-pay | Admitting: Plastic Surgery

## 2019-05-28 VITALS — BP 121/83 | HR 76 | Temp 98.0°F | Ht 70.0 in | Wt 168.0 lb

## 2019-05-28 DIAGNOSIS — Z719 Counseling, unspecified: Secondary | ICD-10-CM

## 2019-05-28 NOTE — Progress Notes (Signed)
Botulinum Toxin Procedure Note  Procedure: Cosmetic botulinum toxin   Pre-operative Diagnosis: Dynamic rhytides  Post-operative Diagnosis: Same  Complications:  None  Brief history: The patient desires botulinum toxin injection of her forehead. I discussed with the patient this proposed procedure of botulinum toxin injections, which is customized depending on the particular needs of the patient. It is performed on facial rhytids as a temporary correction. The alternatives were discussed with the patient. The risks were addressed including bleeding, scarring, infection, damage to deeper structures, asymmetry, and chronic pain, which may occur infrequently after a procedure. The individual's choice to undergo a surgical procedure is based on the comparison of risks to potential benefits. Other risks include unsatisfactory results, brow ptosis, eyelid ptosis, allergic reaction, temporary paralysis, which should go away with time, bruising, blurring disturbances and delayed healing. Botulinum toxin injections do not arrest the aging process or produce permanent tightening of the eyelid.  Operative intervention maybe necessary to maintain the results of a blepharoplasty or botulinum toxin. The patient understands and wishes to proceed. An informed consent was signed and informational brochures given to her prior to the procedure.  Procedure: The area was prepped with alcohol and dried with a clean gauze. Using a clean technique, the botulinum toxin was diluted with 1.25 cc of preservative-free normal saline which was slowly injected with an 18 gauge needle in a tuberculin syringes.  A 32 gauge needles were then used to inject the botulinum toxin. This mixture allow for an aliquot of 5 units per 0.1 cc in each injection site.    Subsequently the mixture was injected in the glabellar and forehead area with preservation of the temporal branch to the lateral eyebrow as well as into each lateral canthal area  beginning from the lateral orbital rim medial to the zygomaticus major in 3 separate areas. A total of 30 Units of botulinum toxin was used. The forehead and glabellar area was injected with care to inject intramuscular only while holding pressure on the supratrochlear vessels in each area during each injection on either side of the medial corrugators. The injection proceeded vertically superiorly to the medial 2/3 of the frontalis muscle and superior 2/3 of the lateral frontalis, again with preservation of the frontal branch.  No complications were noted. Light pressure was held for 5 minutes. She was instructed explicitly in post-operative care.  Botox LOT:  [id number] C2 EXP:  1/23

## 2019-06-19 MED FILL — MELOXICAM 15 MG TABLET: 15 | 90 days supply | Qty: 90 | Fill #0

## 2019-06-23 ENCOUNTER — Other Ambulatory Visit: Payer: Self-pay

## 2019-06-23 ENCOUNTER — Ambulatory Visit (INDEPENDENT_AMBULATORY_CARE_PROVIDER_SITE_OTHER): Payer: Self-pay | Admitting: Plastic Surgery

## 2019-06-23 ENCOUNTER — Encounter: Payer: Self-pay | Admitting: Plastic Surgery

## 2019-06-23 DIAGNOSIS — Z719 Counseling, unspecified: Secondary | ICD-10-CM

## 2019-06-23 NOTE — Progress Notes (Signed)
Preoperative Dx: hyperpigmentation  Postoperative Dx:  same  Procedure: laser to face   Anesthesia: none  Description of Procedure:  Risks and complications were explained to the patient. Consent was confirmed and signed. Time out was called and all information was confirmed to be correct. The area  area was prepped with alcohol and wiped dry. The IPL laser was used to laser the patient's face. The patient tolerated the procedure well and there were no complications. The patient is to follow up in 4 weeks.

## 2019-06-25 DIAGNOSIS — Z13220 Encounter for screening for lipoid disorders: Secondary | ICD-10-CM | POA: Diagnosis not present

## 2019-06-25 DIAGNOSIS — Z0000 Encounter for general adult medical examination without abnormal findings: Secondary | ICD-10-CM | POA: Diagnosis not present

## 2019-06-30 DIAGNOSIS — Z1211 Encounter for screening for malignant neoplasm of colon: Secondary | ICD-10-CM | POA: Diagnosis not present

## 2019-06-30 DIAGNOSIS — N8110 Cystocele, unspecified: Secondary | ICD-10-CM | POA: Diagnosis not present

## 2019-06-30 DIAGNOSIS — R350 Frequency of micturition: Secondary | ICD-10-CM | POA: Diagnosis not present

## 2019-06-30 DIAGNOSIS — Z6826 Body mass index (BMI) 26.0-26.9, adult: Secondary | ICD-10-CM | POA: Diagnosis not present

## 2019-06-30 DIAGNOSIS — Z1239 Encounter for other screening for malignant neoplasm of breast: Secondary | ICD-10-CM | POA: Diagnosis not present

## 2019-06-30 DIAGNOSIS — Z01419 Encounter for gynecological examination (general) (routine) without abnormal findings: Secondary | ICD-10-CM | POA: Diagnosis not present

## 2019-08-07 DIAGNOSIS — R7401 Elevation of levels of liver transaminase levels: Secondary | ICD-10-CM | POA: Diagnosis not present

## 2019-08-07 DIAGNOSIS — Z789 Other specified health status: Secondary | ICD-10-CM | POA: Diagnosis not present

## 2019-09-07 ENCOUNTER — Encounter: Payer: Self-pay | Admitting: Podiatry

## 2019-09-07 ENCOUNTER — Other Ambulatory Visit: Payer: Self-pay

## 2019-09-07 ENCOUNTER — Ambulatory Visit: Payer: 59 | Admitting: Podiatry

## 2019-09-07 DIAGNOSIS — Z79899 Other long term (current) drug therapy: Secondary | ICD-10-CM

## 2019-09-07 DIAGNOSIS — D3610 Benign neoplasm of peripheral nerves and autonomic nervous system, unspecified: Secondary | ICD-10-CM | POA: Diagnosis not present

## 2019-09-07 DIAGNOSIS — L603 Nail dystrophy: Secondary | ICD-10-CM | POA: Diagnosis not present

## 2019-09-07 DIAGNOSIS — B351 Tinea unguium: Secondary | ICD-10-CM | POA: Diagnosis not present

## 2019-09-07 MED ORDER — TERBINAFINE HCL 250 MG PO TABS: 250.0000 mg | ORAL_TABLET | Freq: Every day | ORAL | 0 refills | Status: DC

## 2019-09-07 MED FILL — TERBINAFINE HCL 250 MG TAB: 250 | 90 days supply | Qty: 90 | Fill #0

## 2019-09-07 NOTE — Patient Instructions (Signed)
Terbinafine oral granules What is this medicine? TERBINAFINE (TER bin a feen) is an antifungal medicine. It is used to treat certain kinds of fungal or yeast infections. This medicine may be used for other purposes; ask your health care provider or pharmacist if you have questions. COMMON BRAND NAME(S): Lamisil What should I tell my health care provider before I take this medicine? They need to know if you have any of these conditions:  drink alcoholic beverages  kidney disease  liver disease  an unusual or allergic reaction to Terbinafine, other medicines, foods, dyes, or preservatives  pregnant or trying to get pregnant  breast-feeding How should I use this medicine? Take this medicine by mouth. Follow the directions on the prescription label. Hold packet with cut line on top. Shake packet gently to settle contents. Tear packet open along cut line, or use scissors to cut across line. Carefully pour the entire contents of packet onto a spoonful of a soft food, such as pudding or other soft, non-acidic food such as mashed potatoes (do NOT use applesauce or a fruit-based food). If two packets are required for each dose, you may either sprinkle the content of both packets on one spoonful of non-acidic food, or sprinkle the contents of both packets on two spoonfuls of non-acidic food. Make sure that no granules remain in the packet. Swallow the mxiture of the food and granules without chewing. Take your medicine at regular intervals. Do not take it more often than directed. Take all of your medicine as directed even if you think you are better. Do not skip doses or stop your medicine early. Contact your pediatrician or health care professional regarding the use of this medicine in children. While this medicine may be prescribed for children as young as 4 years for selected conditions, precautions do apply. Overdosage: If you think you have taken too much of this medicine contact a poison control  center or emergency room at once. NOTE: This medicine is only for you. Do not share this medicine with others. What if I miss a dose? If you miss a dose, take it as soon as you can. If it is almost time for your next dose, take only that dose. Do not take double or extra doses. What may interact with this medicine? Do not take this medicine with any of the following medications:  thioridazine This medicine may also interact with the following medications:  beta-blockers  caffeine  cimetidine  cyclosporine  MAOIs like Carbex, Eldepryl, Marplan, Nardil, and Parnate  medicines for fungal infections like fluconazole and ketoconazole  medicines for irregular heartbeat like amiodarone, flecainide and propafenone  rifampin  SSRIs like citalopram, escitalopram, fluoxetine, fluvoxamine, paroxetine and sertraline  tricyclic antidepressants like amitriptyline, clomipramine, desipramine, imipramine, nortriptyline, and others  warfarin This list may not describe all possible interactions. Give your health care provider a list of all the medicines, herbs, non-prescription drugs, or dietary supplements you use. Also tell them if you smoke, drink alcohol, or use illegal drugs. Some items may interact with your medicine. What should I watch for while using this medicine? Your doctor may monitor your liver function. Tell your doctor right away if you have nausea or vomiting, loss of appetite, stomach pain on your right upper side, yellow skin, dark urine, light stools, or are over tired. This medicine may cause serious skin reactions. They can happen weeks to months after starting the medicine. Contact your health care provider right away if you notice fevers or flu-like symptoms   with a rash. The rash may be red or purple and then turn into blisters or peeling of the skin. Or, you might notice a red rash with swelling of the face, lips or lymph nodes in your neck or under your arms. You need to take  this medicine for 6 weeks or longer to cure the fungal infection. Take your medicine regularly for as long as your doctor or health care provider tells you to. What side effects may I notice from receiving this medicine? Side effects that you should report to your doctor or health care professional as soon as possible:  allergic reactions like skin rash or hives, swelling of the face, lips, or tongue  change in vision  dark urine  fever or infection  general ill feeling or flu-like symptoms  light-colored stools  loss of appetite, nausea  rash, fever, and swollen lymph nodes  redness, blistering, peeling or loosening of the skin, including inside the mouth  right upper belly pain  unusually weak or tired  yellowing of the eyes or skin Side effects that usually do not require medical attention (report to your doctor or health care professional if they continue or are bothersome):  changes in taste  diarrhea  hair loss  muscle or joint pain  stomach upset This list may not describe all possible side effects. Call your doctor for medical advice about side effects. You may report side effects to FDA at 1-800-FDA-1088. Where should I keep my medicine? Keep out of the reach of children. Store at room temperature between 15 and 30 degrees C (59 and 86 degrees F). Throw away any unused medicine after the expiration date. NOTE: This sheet is a summary. It may not cover all possible information. If you have questions about this medicine, talk to your doctor, pharmacist, or health care provider.  2020 Elsevier/Gold Standard (2019-01-23 15:35:11)  

## 2019-09-07 NOTE — Progress Notes (Signed)
Subjective: 58 year old female presents the office today for continued concerns.  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  She currently denies any pain.  She states that it does not matter what shoes she wears this is intermittent and sporadic.  Currently no swelling or numbness or tingling to the toes.  She points in between the third and fourth toes where she gets her symptoms.  Also she has concerns of her right toenail.  She had several injuries toenail that has come off previously.  She did have a dark spot of the nail which completely resolved.  She states the nail is loose along the lateral border that she points to and over the last 2 weeks she has noticed discoloration and at the central aspect of nail bed again during her last pedicure.  Currently denies any pain, redness or drainage or any signs of infection.  She is interested in starting Lamisil and she did bring in blood work for me to see today.Denies any systemic complaints such as fevers, chills, nausea, vomiting. No acute changes since last appointment, and no other complaints at this time.   Objective: AAO x3, NAD DP/PT pulses palpable bilaterally, CRT less than 3 seconds At this time there is no palpable neuroma identified there is no area pinpoint tenderness.  Right hallux toenail is loose and underlying nail bed on the lateral distal portion.  She has nail polish on so cannot evaluate her nail but she did show me pictures today.  On the central aspect along with her nail is loose there is a darkened discoloration which appears to be likely old blood.  Nails also slight discoloration or discoloration.  No signs of infection. No pain with calf compression, swelling, warmth, erythema  Assessment: Right hallux onycholysis/onychomycosis, neuroma  Plan: -All treatment options discussed with the patient including all alternatives, risks,  complications.  -Regards the neuroma disability from compensation given her left foot injury.  However there is no symptoms today.  Discussed injection if needed. -Discussed treatment options right hallux toenail.  Blood work was normal.  We will start Lamisil.  Will recheck blood work in 4 weeks.  Also order compound cream through [facility].  Discussed there is a chance the nail may not be attached distally given the history of injuries. -Patient encouraged to call the office with any questions, concerns, change in symptoms.   Trula Slade DPM

## 2019-09-09 DIAGNOSIS — Z86018 Personal history of other benign neoplasm: Secondary | ICD-10-CM | POA: Diagnosis not present

## 2019-09-09 DIAGNOSIS — Z85828 Personal history of other malignant neoplasm of skin: Secondary | ICD-10-CM | POA: Diagnosis not present

## 2019-09-09 DIAGNOSIS — D2272 Melanocytic nevi of left lower limb, including hip: Secondary | ICD-10-CM | POA: Diagnosis not present

## 2019-09-09 DIAGNOSIS — L821 Other seborrheic keratosis: Secondary | ICD-10-CM | POA: Diagnosis not present

## 2019-09-09 DIAGNOSIS — D2230 Melanocytic nevi of unspecified part of face: Secondary | ICD-10-CM | POA: Diagnosis not present

## 2019-09-09 DIAGNOSIS — Z23 Encounter for immunization: Secondary | ICD-10-CM | POA: Diagnosis not present

## 2019-09-09 DIAGNOSIS — D224 Melanocytic nevi of scalp and neck: Secondary | ICD-10-CM | POA: Diagnosis not present

## 2019-09-15 DIAGNOSIS — N819 Female genital prolapse, unspecified: Secondary | ICD-10-CM | POA: Diagnosis not present

## 2019-09-15 DIAGNOSIS — N8110 Cystocele, unspecified: Secondary | ICD-10-CM | POA: Diagnosis not present

## 2019-09-15 DIAGNOSIS — R3915 Urgency of urination: Secondary | ICD-10-CM | POA: Diagnosis not present

## 2019-09-15 DIAGNOSIS — N9419 Other specified dyspareunia: Secondary | ICD-10-CM | POA: Diagnosis not present

## 2019-09-15 DIAGNOSIS — N390 Urinary tract infection, site not specified: Secondary | ICD-10-CM | POA: Diagnosis not present

## 2019-09-15 MED FILL — PREMARIN VAGINAL CREAM-APPL: 0.625 | 90 days supply | Qty: 30 | Fill #0

## 2019-09-17 MED FILL — NITROFURANTOIN MONO-MCR 100: 100 | 5 days supply | Qty: 10 | Fill #0

## 2019-09-22 DIAGNOSIS — M6289 Other specified disorders of muscle: Secondary | ICD-10-CM | POA: Diagnosis not present

## 2019-09-22 DIAGNOSIS — R102 Pelvic and perineal pain: Secondary | ICD-10-CM | POA: Diagnosis not present

## 2019-09-22 DIAGNOSIS — M6281 Muscle weakness (generalized): Secondary | ICD-10-CM | POA: Diagnosis not present

## 2019-09-22 DIAGNOSIS — R3915 Urgency of urination: Secondary | ICD-10-CM | POA: Diagnosis not present

## 2019-09-22 DIAGNOSIS — M62838 Other muscle spasm: Secondary | ICD-10-CM | POA: Diagnosis not present

## 2019-10-02 ENCOUNTER — Telehealth: Payer: Self-pay | Admitting: *Deleted

## 2019-10-02 DIAGNOSIS — R3915 Urgency of urination: Secondary | ICD-10-CM | POA: Diagnosis not present

## 2019-10-02 DIAGNOSIS — M6289 Other specified disorders of muscle: Secondary | ICD-10-CM | POA: Diagnosis not present

## 2019-10-02 DIAGNOSIS — M62838 Other muscle spasm: Secondary | ICD-10-CM | POA: Diagnosis not present

## 2019-10-02 DIAGNOSIS — M6281 Muscle weakness (generalized): Secondary | ICD-10-CM | POA: Diagnosis not present

## 2019-10-02 DIAGNOSIS — R102 Pelvic and perineal pain: Secondary | ICD-10-CM | POA: Diagnosis not present

## 2019-10-02 NOTE — Telephone Encounter (Signed)
Called and spoke with Gerald Stabs at the [facility] and I stated that I was trying to see what happened with the topical for the patient's nails and Gerald Stabs stated that they tried to reach out to the patient and never got a response and I called and left a voicemail for the patient to call the [employer] and gave the telephone number and stated to call the office if any concerns or questions. Lattie Haw

## 2019-10-05 ENCOUNTER — Telehealth: Payer: Self-pay | Admitting: *Deleted

## 2019-10-05 NOTE — Telephone Encounter (Signed)
Pt states she received a call Friday concerning her compound medication.

## 2019-10-05 NOTE — Telephone Encounter (Signed)
I spoke with pt and told her L. Cox, CMA had called her Friday to give her [facility]'s phone number. Pt states she spoke with them this morning.

## 2019-10-12 DIAGNOSIS — R1084 Generalized abdominal pain: Secondary | ICD-10-CM | POA: Diagnosis not present

## 2019-10-12 DIAGNOSIS — Z87442 Personal history of urinary calculi: Secondary | ICD-10-CM | POA: Diagnosis not present

## 2019-10-15 ENCOUNTER — Other Ambulatory Visit: Payer: Self-pay | Admitting: Podiatry

## 2019-10-15 DIAGNOSIS — M6289 Other specified disorders of muscle: Secondary | ICD-10-CM | POA: Diagnosis not present

## 2019-10-15 DIAGNOSIS — R102 Pelvic and perineal pain: Secondary | ICD-10-CM | POA: Diagnosis not present

## 2019-10-15 DIAGNOSIS — M6281 Muscle weakness (generalized): Secondary | ICD-10-CM | POA: Diagnosis not present

## 2019-10-15 DIAGNOSIS — R3915 Urgency of urination: Secondary | ICD-10-CM | POA: Diagnosis not present

## 2019-10-15 DIAGNOSIS — M62838 Other muscle spasm: Secondary | ICD-10-CM | POA: Diagnosis not present

## 2019-10-15 DIAGNOSIS — R1084 Generalized abdominal pain: Secondary | ICD-10-CM | POA: Diagnosis not present

## 2019-10-15 DIAGNOSIS — Z79899 Other long term (current) drug therapy: Secondary | ICD-10-CM | POA: Diagnosis not present

## 2019-10-16 LAB — CBC WITH DIFFERENTIAL/PLATELET
Basophils Absolute: 0.1 10*3/uL (ref 0.0–0.2)
Basos: 1 %
EOS (ABSOLUTE): 0.1 10*3/uL (ref 0.0–0.4)
Eos: 2 %
Hematocrit: 42.9 % (ref 34.0–46.6)
Hemoglobin: 14.6 g/dL (ref 11.1–15.9)
Immature Grans (Abs): 0 10*3/uL (ref 0.0–0.1)
Immature Granulocytes: 0 %
Lymphocytes Absolute: 1.8 10*3/uL (ref 0.7–3.1)
Lymphs: 37 %
MCH: 29.5 pg (ref 26.6–33.0)
MCHC: 34 g/dL (ref 31.5–35.7)
MCV: 87 fL (ref 79–97)
Monocytes Absolute: 0.5 10*3/uL (ref 0.1–0.9)
Monocytes: 11 %
Neutrophils Absolute: 2.4 10*3/uL (ref 1.4–7.0)
Neutrophils: 49 %
Platelets: 289 10*3/uL (ref 150–450)
RBC: 4.95 x10E6/uL (ref 3.77–5.28)
RDW: 12.6 % (ref 11.7–15.4)
WBC: 4.8 10*3/uL (ref 3.4–10.8)

## 2019-10-16 LAB — HEPATIC FUNCTION PANEL
ALT: 24 IU/L (ref 0–32)
AST: 23 IU/L (ref 0–40)
Albumin: 4.5 g/dL (ref 3.8–4.9)
Alkaline Phosphatase: 96 IU/L (ref 39–117)
Bilirubin Total: 0.3 mg/dL (ref 0.0–1.2)
Bilirubin, Direct: 0.11 mg/dL (ref 0.00–0.40)
Total Protein: 6.5 g/dL (ref 6.0–8.5)

## 2019-10-19 ENCOUNTER — Encounter: Payer: Self-pay | Admitting: Podiatry

## 2019-10-19 ENCOUNTER — Other Ambulatory Visit: Payer: Self-pay

## 2019-10-19 ENCOUNTER — Ambulatory Visit (INDEPENDENT_AMBULATORY_CARE_PROVIDER_SITE_OTHER): Payer: 59 | Admitting: Podiatry

## 2019-10-19 DIAGNOSIS — D3610 Benign neoplasm of peripheral nerves and autonomic nervous system, unspecified: Secondary | ICD-10-CM | POA: Diagnosis not present

## 2019-10-19 DIAGNOSIS — B351 Tinea unguium: Secondary | ICD-10-CM

## 2019-10-19 DIAGNOSIS — Z79899 Other long term (current) drug therapy: Secondary | ICD-10-CM | POA: Diagnosis not present

## 2019-10-19 DIAGNOSIS — S99921A Unspecified injury of right foot, initial encounter: Secondary | ICD-10-CM | POA: Diagnosis not present

## 2019-10-25 NOTE — Progress Notes (Signed)
Subjective: 58 year old female presents the office today for follow evaluation of right nail fungus.  She is currently on Lamisil without any side effects.  She is also been using a topical nail fungus treatment through [employer] [facility].  She has been alternating states that cannot fully evaluate the nail but she did show pictures that it is getting better.  A layer of nail did come off.  She has no pain in the nail.  He is also still concerned about a neuroma on the right foot that she states started after she injured her left foot due to compensation her right foot started hurting.  She states that the pain is intermittent. Denies any systemic complaints such as fevers, chills, nausea, vomiting. No acute changes since last appointment, and no other complaints at this time.   Objective: AAO x3, NAD DP/PT pulses palpable bilaterally, CRT less than 3 seconds Small palpable neuroma present to right third interspace with tenderness palpation.  There is no area pinpoint tenderness there is no edema, erythema.  Overall the right toenail I cannot fully evaluate because of polish however she did show pictures today with improvement.  There is no pain of the nail is no redness or drainage or any signs of infection. No pain with calf compression, swelling, warmth, erythema  Assessment: 58 year old female with onychomycosis, neuroma  Plan: -All treatment options discussed with the patient including all alternatives, risks, complications.  -Injection to the right third interspace neuroma performed.  The skin was prepped with alcohol and mixture of 1 cc Kenalog 10, 0.5 cc of Marcaine plain, 0.5 cc of lidocaine plain was infiltrated on the area of maximal tenderness to the neuroma without complications.  Continue neuroma pad as well. -Continue Lamisil as well as topical antifungal.  Repeat blood work within normal limits.  If no improvement discussed total nail removal.  I do think part of this is from fungus  but also due to injury to the toenail. -Patient encouraged to call the office with any questions, concerns, change in symptoms.   Return in about 4 weeks (around 11/16/2019).  Trula Slade DPM

## 2019-10-30 HISTORY — PX: SACROPLASTY: SHX6797

## 2019-10-31 DIAGNOSIS — Z03818 Encounter for observation for suspected exposure to other biological agents ruled out: Secondary | ICD-10-CM | POA: Diagnosis not present

## 2019-10-31 DIAGNOSIS — Z20828 Contact with and (suspected) exposure to other viral communicable diseases: Secondary | ICD-10-CM | POA: Diagnosis not present

## 2019-11-10 ENCOUNTER — Ambulatory Visit: Payer: 59 | Attending: Internal Medicine

## 2019-11-10 DIAGNOSIS — Z23 Encounter for immunization: Secondary | ICD-10-CM | POA: Insufficient documentation

## 2019-11-16 ENCOUNTER — Ambulatory Visit: Payer: 59 | Admitting: Podiatry

## 2019-11-24 DIAGNOSIS — N819 Female genital prolapse, unspecified: Secondary | ICD-10-CM | POA: Diagnosis not present

## 2019-11-24 DIAGNOSIS — N9419 Other specified dyspareunia: Secondary | ICD-10-CM | POA: Diagnosis not present

## 2019-11-24 DIAGNOSIS — N8111 Cystocele, midline: Secondary | ICD-10-CM | POA: Diagnosis not present

## 2019-11-24 MED FILL — traMADol HCL 50 MG TABS: 50 | 3 days supply | Qty: 10 | Fill #0

## 2019-11-28 ENCOUNTER — Ambulatory Visit: Payer: 59 | Attending: Internal Medicine

## 2019-11-28 ENCOUNTER — Ambulatory Visit: Payer: Self-pay

## 2019-11-28 DIAGNOSIS — Z23 Encounter for immunization: Secondary | ICD-10-CM | POA: Insufficient documentation

## 2019-11-28 NOTE — Progress Notes (Signed)
   Covid-19 Vaccination Clinic  Name:  Laura Wall    MRN: [id number] DOB: 03-Dec-1960  11/28/2019  [name] was observed post Covid-19 immunization for 15 minutes without incidence. She was provided with Vaccine Information Sheet and instruction to access the V-Safe system.   [name] was instructed to call 911 with any severe reactions post vaccine: Marland Kitchen Difficulty breathing  . Swelling of your face and throat  . A fast heartbeat  . A bad rash all over your body  . Dizziness and weakness    Immunizations Administered    Name Date Dose VIS Date Route   Pfizer COVID-19 Vaccine 11/28/2019  1:23 PM 0.3 mL 10/09/2019 Intramuscular   Manufacturer: [place]   Lot: [id number]   [place]: [id number]

## 2019-11-30 DIAGNOSIS — Z20822 Contact with and (suspected) exposure to covid-19: Secondary | ICD-10-CM | POA: Diagnosis not present

## 2019-11-30 DIAGNOSIS — N891 Moderate vaginal dysplasia: Secondary | ICD-10-CM | POA: Diagnosis not present

## 2019-11-30 DIAGNOSIS — Z01812 Encounter for preprocedural laboratory examination: Secondary | ICD-10-CM | POA: Diagnosis not present

## 2019-12-01 ENCOUNTER — Encounter: Payer: Self-pay | Admitting: Plastic Surgery

## 2019-12-01 ENCOUNTER — Other Ambulatory Visit: Payer: Self-pay

## 2019-12-01 ENCOUNTER — Ambulatory Visit (INDEPENDENT_AMBULATORY_CARE_PROVIDER_SITE_OTHER): Payer: Self-pay | Admitting: Plastic Surgery

## 2019-12-01 VITALS — BP 116/80 | HR 69 | Temp 96.2°F | Ht 70.0 in | Wt 162.6 lb

## 2019-12-01 DIAGNOSIS — Z719 Counseling, unspecified: Secondary | ICD-10-CM

## 2019-12-01 NOTE — Progress Notes (Signed)
Botulinum Toxin and Filler Injection Procedure Note  Procedure: Cosmetic botulinum toxin and Filler administration  Pre-operative Diagnosis: Dynamic rhytides and midface volume loss  Post-operative Diagnosis: Same  Complications:  None  Brief history: The patient desires botulinum toxin injection of her forehead. I discussed with the patient this proposed procedure of botulinum toxin injections, which is customized depending on the particular needs of the patient. It is performed on facial rhytids as a temporary correction. The alternatives were discussed with the patient. The risks were addressed including bleeding, scarring, infection, damage to deeper structures, asymmetry, and chronic pain, which may occur infrequently after a procedure. The individual's choice to undergo a surgical procedure is based on the comparison of risks to potential benefits. Other risks include unsatisfactory results, brow ptosis, eyelid ptosis, allergic reaction, temporary paralysis, which should go away with time, bruising, blurring disturbances and delayed healing. Botulinum toxin injections do not arrest the aging process or produce permanent tightening of the eyelid.  Operative intervention maybe necessary to maintain the results of a blepharoplasty or botulinum toxin. The patient understands and wishes to proceed. An informed consent was signed and informational brochures given to her prior to the procedure.  Procedure: The area was prepped with alcohol and dried with a clean gauze. Using a clean technique, the botulinum toxin was diluted with 1.25 cc of preservative-free normal saline which was slowly injected with an 18 gauge needle in a tuberculin syringes.  A 32 gauge needles were then used to inject the botulinum toxin. This mixture allow for an aliquot of 5 units per 0.1 cc in each injection site.    Subsequently the mixture was injected in the glabellar and forehead area with preservation of the temporal  branch to the lateral eyebrow as well as into each lateral canthal area beginning from the lateral orbital rim medial to the zygomaticus major in 3 separate areas. A total of 40 Units of botulinum toxin was used. The forehead and glabellar area was injected with care to inject intramuscular only while holding pressure on the supratrochlear vessels in each area during each injection on either side of the medial corrugators. The injection proceeded vertically superiorly to the medial 2/3 of the frontalis muscle and superior 2/3 of the lateral frontalis, again with preservation of the frontal branch.  The midface area was injected at the 3 sub-regions of the mid-face: zygomaticomalar region, anteromedial cheek region, and submalar region for a total of one syringe on each side of the face. The technique used was serial puncture with equal injections in the 3 sub-regions: the zygomaticomalar region, the anteromedial cheek, and the submalar region.  No complications were noted. Light pressure was held for 5 minutes. She was instructed explicitly in post-operative care.  Botox LOT:  [id number] EXP:  10/23  J. Reuel Boom [id number] EXP: 2020-03-14

## 2019-12-03 MED FILL — MELOXICAM 15 MG TABLET: 15 | 90 days supply | Qty: 90 | Fill #0

## 2019-12-03 MED FILL — FOLIVANE-PLUS CAPSULE: 90 days supply | Qty: 90 | Fill #0

## 2019-12-07 DIAGNOSIS — N993 Prolapse of vaginal vault after hysterectomy: Secondary | ICD-10-CM | POA: Diagnosis not present

## 2019-12-07 DIAGNOSIS — Z4002 Encounter for prophylactic removal of ovary: Secondary | ICD-10-CM | POA: Diagnosis not present

## 2019-12-07 DIAGNOSIS — N812 Incomplete uterovaginal prolapse: Secondary | ICD-10-CM | POA: Diagnosis not present

## 2019-12-07 DIAGNOSIS — R3915 Urgency of urination: Secondary | ICD-10-CM | POA: Diagnosis not present

## 2019-12-10 MED FILL — traMADol HCL 50 MG TABS: 50 | 5 days supply | Qty: 10 | Fill #0

## 2019-12-21 ENCOUNTER — Telehealth (INDEPENDENT_AMBULATORY_CARE_PROVIDER_SITE_OTHER): Payer: 59 | Admitting: Podiatry

## 2019-12-21 ENCOUNTER — Other Ambulatory Visit: Payer: Self-pay

## 2019-12-21 DIAGNOSIS — D3610 Benign neoplasm of peripheral nerves and autonomic nervous system, unspecified: Secondary | ICD-10-CM | POA: Diagnosis not present

## 2019-12-21 DIAGNOSIS — B351 Tinea unguium: Secondary | ICD-10-CM | POA: Diagnosis not present

## 2019-12-21 NOTE — Progress Notes (Signed)
Virtual Visit via Video Note  I connected with Laura Wall on 12/21/19 at 11:45 AM EST by a video enabled telemedicine application and verified that I am speaking with the correct person using two identifiers.  Location: Patient: Home Provider: Office   I discussed the limitations of evaluation and management by telemedicine and the availability of in person appointments. The patient expressed understanding and agreed to proceed.  History of Present Illness: 59 year old female has a history of right hallux nail fungus, discoloration.  She she was on Lamisil and tolerated medication well.  She also using a compound cream for onychomycosis which has been helpful.  She does feel that the nail has been growing out.  She denies any pain currently denies any redness or drainage or any swelling.  As far as the neuroma on the right foot currently no significant discomfort.  She recently just had surgery and she has been off of her foot and she is not at work on medical leave.   Observations/Objective: Difficult to evaluate but currently denies any pain to the foot and the nail appears to be improving.  There is no signs of infection.  Assessment and Plan: Onychomycosis, neuroma  We will continue compound cream which already ordered today through [facility].  Monitoring signs or symptoms of infection.  Currently the neuroma is asymptomatic.  She is also been off of her feet more.  We will continue to monitor.  Follow Up Instructions: Follow-up as needed    I discussed the assessment and treatment plan with the patient. The patient was provided an opportunity to ask questions and all were answered. The patient agreed with the plan and demonstrated an understanding of the instructions.   The patient was advised to call back or seek an in-person evaluation if the symptoms worsen or if the condition fails to improve as anticipated.  I provided 5 minutes of non-face-to-face time during  this encounter.   Trula Slade, DPM

## 2019-12-30 ENCOUNTER — Other Ambulatory Visit: Payer: Self-pay

## 2019-12-30 ENCOUNTER — Ambulatory Visit (INDEPENDENT_AMBULATORY_CARE_PROVIDER_SITE_OTHER): Payer: Self-pay

## 2019-12-30 VITALS — BP 119/79 | HR 70 | Temp 98.4°F

## 2019-12-30 DIAGNOSIS — N62 Hypertrophy of breast: Secondary | ICD-10-CM

## 2019-12-30 DIAGNOSIS — Z719 Counseling, unspecified: Secondary | ICD-10-CM

## 2019-12-31 MED ORDER — LIDOCAINE-PRILOCAINE 2.5-2.5 % EX CREA: 1.0000 "application " | TOPICAL_CREAM | CUTANEOUS | 0 refills | Status: DC | PRN

## 2019-12-31 NOTE — Progress Notes (Signed)
Preoperative Dx: facial aging  Postoperative Dx:  same  Procedure: laser to face  Anesthesia: no EMLA had been prescribed to pt for use prior to procedure  Description of Procedure:  Risks and complications were explained to the patient. Consent was confirmed and signed. Time out was called and all information was confirmed to be correct. The area  was prepped with alcohol and wiped dry. The FRAX laser was set at the following:  skin -1/ suntan light -  40/25 with 3 passes 40 for cheeks & decreased to 35 for forehead/mouth  & eye area  The entire face was lasered The patient tolerated the procedure well and there were no complications.   Aloe & post laser balm  applied after procedure She is reminded to protect skin with sunscreen & moisturizer & avoid retina products & no abrasive scrubs for approx. 10 days  She is reminded that she may experience redness/peeling & irritation Patient to f/u in 6 weeks- she plans to have a 2nd FRAX laser procedure at her next appointment I gave pt sample of sunscreen- she is allergic to titanium- & we examined the sunscreen & the sample did not have titanium listed & she wants to try it She is going to be in [place]. at the end of March- & she is reminded to cover her face with hat & sunscreen while there. I will send in RX for EMLA cream for her next laser procedure- she understands to apply 45 mins prior to procedure  She will call for any concerns [facility]

## 2020-01-20 DIAGNOSIS — H9311 Tinnitus, right ear: Secondary | ICD-10-CM | POA: Diagnosis not present

## 2020-01-27 DIAGNOSIS — H524 Presbyopia: Secondary | ICD-10-CM | POA: Diagnosis not present

## 2020-01-27 DIAGNOSIS — H5203 Hypermetropia, bilateral: Secondary | ICD-10-CM | POA: Diagnosis not present

## 2020-03-07 DIAGNOSIS — H9313 Tinnitus, bilateral: Secondary | ICD-10-CM | POA: Diagnosis not present

## 2020-03-07 DIAGNOSIS — H903 Sensorineural hearing loss, bilateral: Secondary | ICD-10-CM | POA: Diagnosis not present

## 2020-03-25 DIAGNOSIS — Z1231 Encounter for screening mammogram for malignant neoplasm of breast: Secondary | ICD-10-CM | POA: Diagnosis not present

## 2020-06-22 MED FILL — FOLIVANE-PLUS CAPSULE: 90 days supply | Qty: 90 | Fill #1

## 2020-06-22 MED FILL — MELOXICAM 15 MG TABLET: 15 | 90 days supply | Qty: 90 | Fill #1

## 2020-06-27 ENCOUNTER — Encounter: Payer: 59 | Admitting: Plastic Surgery

## 2020-06-29 DIAGNOSIS — Z13220 Encounter for screening for lipoid disorders: Secondary | ICD-10-CM | POA: Diagnosis not present

## 2020-06-29 DIAGNOSIS — M19049 Primary osteoarthritis, unspecified hand: Secondary | ICD-10-CM | POA: Diagnosis not present

## 2020-06-29 DIAGNOSIS — Z0000 Encounter for general adult medical examination without abnormal findings: Secondary | ICD-10-CM | POA: Diagnosis not present

## 2020-06-29 DIAGNOSIS — F900 Attention-deficit hyperactivity disorder, predominantly inattentive type: Secondary | ICD-10-CM | POA: Diagnosis not present

## 2020-06-29 DIAGNOSIS — Z131 Encounter for screening for diabetes mellitus: Secondary | ICD-10-CM | POA: Diagnosis not present

## 2020-07-01 ENCOUNTER — Encounter: Payer: Self-pay | Admitting: Plastic Surgery

## 2020-07-01 ENCOUNTER — Other Ambulatory Visit: Payer: Self-pay

## 2020-07-01 ENCOUNTER — Ambulatory Visit (INDEPENDENT_AMBULATORY_CARE_PROVIDER_SITE_OTHER): Payer: Self-pay | Admitting: Plastic Surgery

## 2020-07-01 VITALS — BP 119/76 | HR 74 | Temp 98.1°F

## 2020-07-01 DIAGNOSIS — Z719 Counseling, unspecified: Secondary | ICD-10-CM

## 2020-07-01 NOTE — Progress Notes (Signed)
Botulinum Toxin Procedure Note  Procedure: Cosmetic botulinum toxi  Pre-operative Diagnosis: Dynamic rhytides   Post-operative Diagnosis: Same  Complications:  None  Brief history: The patient desires botulinum toxin injection of her forehead. I discussed with the patient this proposed procedure of botulinum toxin injections, which is customized depending on the particular needs of the patient. It is performed on facial rhytids as a temporary correction. The alternatives were discussed with the patient. The risks were addressed including bleeding, scarring, infection, damage to deeper structures, asymmetry, and chronic pain, which may occur infrequently after a procedure. The individual's choice to undergo a surgical procedure is based on the comparison of risks to potential benefits. Other risks include unsatisfactory results, brow ptosis, eyelid ptosis, allergic reaction, temporary paralysis, which should go away with time, bruising, blurring disturbances and delayed healing. Botulinum toxin injections do not arrest the aging process or produce permanent tightening of the eyelid.  Operative intervention maybe necessary to maintain the results of a blepharoplasty or botulinum toxin. The patient understands and wishes to proceed.  Procedure: The area was prepped with alcohol and dried with a clean gauze. Using a clean technique, the botulinum toxin was diluted with 1.25 cc of preservative-free normal saline which was slowly injected with an 18 gauge needle in a tuberculin syringes.  A 32 gauge needles were then used to inject the botulinum toxin. This mixture allow for an aliquot of 5 units per 0.1 cc in each injection site.    Subsequently the mixture was injected in the glabellar and forehead area with preservation of the temporal branch to the lateral eyebrow as well as into each lateral canthal area beginning from the lateral orbital rim medial to the zygomaticus major in 3 separate areas. A total  of 40 Units of botulinum toxin was used. The forehead and glabellar area was injected with care to inject intramuscular only while holding pressure on the supratrochlear vessels in each area during each injection on either side of the medial corrugators. The injection proceeded vertically superiorly to the medial 2/3 of the frontalis muscle and superior 2/3 of the lateral frontalis, again with preservation of the frontal branch.  No complications were noted. Light pressure was held for 5 minutes. She was instructed explicitly in post-operative care.  Botox LOT:  [id number] C4 EXP:  11/23

## 2020-07-18 DIAGNOSIS — Z1239 Encounter for other screening for malignant neoplasm of breast: Secondary | ICD-10-CM | POA: Diagnosis not present

## 2020-07-18 DIAGNOSIS — Z6826 Body mass index (BMI) 26.0-26.9, adult: Secondary | ICD-10-CM | POA: Diagnosis not present

## 2020-07-18 DIAGNOSIS — Z1211 Encounter for screening for malignant neoplasm of colon: Secondary | ICD-10-CM | POA: Diagnosis not present

## 2020-07-18 DIAGNOSIS — Z01419 Encounter for gynecological examination (general) (routine) without abnormal findings: Secondary | ICD-10-CM | POA: Diagnosis not present

## 2020-08-31 ENCOUNTER — Other Ambulatory Visit: Payer: Self-pay | Admitting: Podiatry

## 2020-08-31 ENCOUNTER — Encounter: Payer: Self-pay | Admitting: Podiatry

## 2020-08-31 DIAGNOSIS — D3610 Benign neoplasm of peripheral nerves and autonomic nervous system, unspecified: Secondary | ICD-10-CM

## 2020-09-05 ENCOUNTER — Ambulatory Visit (HOSPITAL_COMMUNITY): Admission: RE | Admit: 2020-09-05 | Payer: 59 | Source: Ambulatory Visit

## 2020-09-07 ENCOUNTER — Other Ambulatory Visit: Payer: Self-pay

## 2020-09-07 ENCOUNTER — Ambulatory Visit
Admission: RE | Admit: 2020-09-07 | Discharge: 2020-09-07 | Disposition: A | Discharge status: Home / Self Care | Payer: 59 | Source: Ambulatory Visit | Attending: Podiatry | Admitting: Podiatry

## 2020-09-07 DIAGNOSIS — M79671 Pain in right foot: Secondary | ICD-10-CM | POA: Diagnosis not present

## 2020-09-07 DIAGNOSIS — D3610 Benign neoplasm of peripheral nerves and autonomic nervous system, unspecified: Secondary | ICD-10-CM | POA: Diagnosis not present

## 2020-09-13 ENCOUNTER — Encounter: Payer: Self-pay | Admitting: Podiatry

## 2020-09-19 ENCOUNTER — Ambulatory Visit (INDEPENDENT_AMBULATORY_CARE_PROVIDER_SITE_OTHER): Payer: 59 | Admitting: Podiatry

## 2020-09-19 ENCOUNTER — Other Ambulatory Visit: Payer: Self-pay

## 2020-09-19 DIAGNOSIS — D3610 Benign neoplasm of peripheral nerves and autonomic nervous system, unspecified: Secondary | ICD-10-CM | POA: Diagnosis not present

## 2020-09-19 NOTE — Progress Notes (Signed)
Subjective: 59 year old female presents the office today for follow-up evaluation of right foot pain, neuroma discussed ultrasound results.  She had ultrasound performed that did reveal a neuroma to the fourth interspace of the right foot.  Discussed this.  Since she presents today for dehydrated sclerosing alcohol injection. Denies any systemic complaints such as fevers, chills, nausea, vomiting. No acute changes since last appointment, and no other complaints at this time.   Objective: AAO x3, NAD DP/PT pulses palpable bilaterally, CRT less than 3 seconds There is tenderness on the fourth interspace of the right foot.  There is no palpable neuroma or clicking sensation but this is where she has discomfort.  No edema, erythema.  No area of pinpoint tenderness.  MMT 5/5. No pain with calf compression, swelling, warmth, erythema  Assessment: Neuroma right fourth interspace  Plan: -All treatment options discussed with the patient including all alternatives, risks, complications.  -Again reviewed the ultrasound which revealed neuroma.  Discussed options and wants to proceed with the dehydrated alcohol injection.  She underwent her first injection today.  The skin was cleaned with alcohol and a mixture of 1 cc mixture of dehydrated sclerosing alcohol was infiltrated into the area of tenderness without complications.  Postinjection care discussed.  Tolerated well.  Metatarsal offloading pad dispensed -Patient encouraged to call the office with any questions, concerns, change in symptoms.   Trula Slade DPM

## 2020-10-07 DIAGNOSIS — E7800 Pure hypercholesterolemia, unspecified: Secondary | ICD-10-CM | POA: Diagnosis not present

## 2020-10-10 ENCOUNTER — Ambulatory Visit: Payer: 59 | Admitting: Podiatry

## 2020-10-10 ENCOUNTER — Other Ambulatory Visit: Payer: Self-pay

## 2020-10-10 DIAGNOSIS — D3610 Benign neoplasm of peripheral nerves and autonomic nervous system, unspecified: Secondary | ICD-10-CM

## 2020-10-17 NOTE — Progress Notes (Signed)
Subjective: 59 year old female presents the office today for follow-up evaluation of neuroma on the right foot.  She states the last injection was not helpful.  She points 1 in between the third and fourth metatarsal heads where she has majority discomfort.  Currently not as painful as what it is other times. Denies any systemic complaints such as fevers, chills, nausea, vomiting. No acute changes since last appointment, and no other complaints at this time.   Objective: AAO x3, NAD DP/PT pulses palpable bilaterally, CRT less than 3 seconds To the majority tenderness is along the third interspace there is a small palpable neuroma identified at this interspace there is no significant pain on fourth interspace. No area of pinpoint tenderness.  Flexor, extensor tendons appear to be intact.  MMT 5/5. No pain with calf compression, swelling, warmth, erythema  Assessment: Neuroma right foot  Plan: -All treatment options discussed with the patient including all alternatives, risks, complications.  -Today we performed another injection however today was performed to the third interspace as this is where the majority of symptoms are localized I think this is where the neuroma is.  Skin was cleaned with alcohol and 1.2 cc of dehydrated alcohol was infiltrated into the area of tenderness on the room without any complications.  Postinjection care discussed. -Patient encouraged to call the office with any questions, concerns, change in symptoms.   Trula Slade DPM

## 2020-10-29 HISTORY — PX: NEUROMA SURGERY: SHX722

## 2020-11-02 ENCOUNTER — Other Ambulatory Visit (HOSPITAL_COMMUNITY): Payer: Self-pay | Admitting: Dermatology

## 2020-11-02 DIAGNOSIS — L578 Other skin changes due to chronic exposure to nonionizing radiation: Secondary | ICD-10-CM | POA: Diagnosis not present

## 2020-11-02 DIAGNOSIS — L570 Actinic keratosis: Secondary | ICD-10-CM | POA: Diagnosis not present

## 2020-11-02 DIAGNOSIS — D225 Melanocytic nevi of trunk: Secondary | ICD-10-CM | POA: Diagnosis not present

## 2020-11-02 DIAGNOSIS — D224 Melanocytic nevi of scalp and neck: Secondary | ICD-10-CM | POA: Diagnosis not present

## 2020-11-02 DIAGNOSIS — Z86018 Personal history of other benign neoplasm: Secondary | ICD-10-CM | POA: Diagnosis not present

## 2020-11-02 DIAGNOSIS — Z85828 Personal history of other malignant neoplasm of skin: Secondary | ICD-10-CM | POA: Diagnosis not present

## 2020-11-02 DIAGNOSIS — L821 Other seborrheic keratosis: Secondary | ICD-10-CM | POA: Diagnosis not present

## 2020-11-02 DIAGNOSIS — D2272 Melanocytic nevi of left lower limb, including hip: Secondary | ICD-10-CM | POA: Diagnosis not present

## 2020-11-02 DIAGNOSIS — D2230 Melanocytic nevi of unspecified part of face: Secondary | ICD-10-CM | POA: Diagnosis not present

## 2020-11-02 MED FILL — FLUOROURACIL 5 % CREA: 5 | 21 days supply | Qty: 40 | Fill #0

## 2020-11-08 ENCOUNTER — Ambulatory Visit: Payer: 59 | Admitting: Podiatry

## 2020-11-08 ENCOUNTER — Other Ambulatory Visit: Payer: Self-pay

## 2020-11-08 DIAGNOSIS — D3610 Benign neoplasm of peripheral nerves and autonomic nervous system, unspecified: Secondary | ICD-10-CM

## 2020-11-09 ENCOUNTER — Encounter: Payer: Self-pay | Admitting: Podiatry

## 2020-11-09 MED ORDER — TRIAMCINOLONE ACETONIDE 40 MG/ML IJ SUSP: 10.0000 mg | Freq: Once | INTRAMUSCULAR | Status: DC

## 2020-11-09 MED ORDER — DEXAMETHASONE SODIUM PHOSPHATE 4 MG/ML IJ SOLN: 2.0000 mg | Freq: Once | INTRAMUSCULAR | Status: DC

## 2020-11-09 NOTE — Progress Notes (Signed)
  Subjective:  Patient ID: Laura Wall, female    DOB: 09-17-1961,  MRN: [id number]  Chief Complaint  Patient presents with  . Neuroma      3w f/u neuroma    60 y.o. female presents with the above complaint. History confirmed with patient.  Normally she has been seeing Dr. Jacqualyn Posey for this who has been perorming sclerosing alcohol injections, today she sees me for the same because he is delayed in surgery.  The neuroma has been diagnosed with ultrasound.  The last sclerosing alcohol injection in the 1 before that were not helpful.  Objective:  Physical Exam: warm, good capillary refill, no trophic changes or ulcerative lesions, normal DP and PT pulses and normal sensory exam.   Right Foot: Able to palpate and reproduce Mulder's click on the plantar lateral side of the third interspace just medial to the fourth MTPJ which produces a paresthesia into the digits    Assessment:   1. Neuroma      Plan:  Patient was evaluated and treated and all questions answered.   Discussed further treatment options including alternate injections with corticosteroid as well as possible excision.  She would like to avoid surgery at all cost.  I recommended injection with corticosteroid since the sclerosing alcohol injections have not improved her symptoms so far.  Following sterile prep with alcohol, 0.5 cc of 2% lidocaine, 2 mg of dexamethasone phosphate and 10 mg of Kenalog was injected from a dorsal approach into the area of maximal tenderness on the plantar foot where the Mulder's click was reproducible.  She tolerated procedure well and was dressed with a Band-Aid.  Return in about 1 month (around 12/09/2020) for re-check neuroma.

## 2020-12-09 ENCOUNTER — Ambulatory Visit: Payer: 59 | Admitting: Podiatry

## 2020-12-12 ENCOUNTER — Encounter: Payer: Self-pay | Admitting: Podiatry

## 2020-12-12 ENCOUNTER — Other Ambulatory Visit: Payer: Self-pay

## 2020-12-12 ENCOUNTER — Ambulatory Visit (INDEPENDENT_AMBULATORY_CARE_PROVIDER_SITE_OTHER): Payer: 59 | Admitting: Podiatry

## 2020-12-12 DIAGNOSIS — G5761 Lesion of plantar nerve, right lower limb: Secondary | ICD-10-CM

## 2020-12-12 DIAGNOSIS — D3610 Benign neoplasm of peripheral nerves and autonomic nervous system, unspecified: Secondary | ICD-10-CM

## 2020-12-12 MED ORDER — TRIAMCINOLONE ACETONIDE 40 MG/ML IJ SUSP
10.0000 mg | Freq: Once | INTRAMUSCULAR | Status: AC
  Administered 2020-12-12: 10 mg

## 2020-12-12 MED ORDER — DEXAMETHASONE SODIUM PHOSPHATE 4 MG/ML IJ SOLN
4.0000 mg | Freq: Once | INTRAMUSCULAR | Status: AC
  Administered 2020-12-12: 4 mg

## 2020-12-12 NOTE — Progress Notes (Signed)
  Subjective:  Patient ID: Laura Wall, female    DOB: Mar 16, 1961,  MRN: [id number]  Chief Complaint  Patient presents with  . Neuroma    PT stated that her foot is doing worse.    60 y.o. female returns with the above complaint. History confirmed with patient.  She has not had improvement since her last visit, it actually feels worse. She had relief for about one day.   Objective:  Physical Exam: warm, good capillary refill, no trophic changes or ulcerative lesions, normal DP and PT pulses and normal sensory exam.   Right Foot: Today pain was more centered over the 4th plantar interspace over the lateral portion of the 4th MTPJ. No plantar plate injury noted    Assessment:   1. Neuroma      Plan:  Patient was evaluated and treated and all questions answered.   We again discussed further treatment options. Unfortunately she has not improved with multiple injections now. We discussed the option of excision, she strongly would like to avoid excision. Today we tried another injection of corticosteroid today via plantar approach. Following sterile prep with alcohol, 0.5 cc of 2% lidocaine, 4 mg of dexamethasone phosphate and 10 mg of Kenalog was injected from a plantar approach into the area of maximal tenderness on the plantar foot along the lateral plantar 4th metatarsal. She tolerated the procedure well and was dressed with a Band-Aid.  Return in about 1 month (around 01/09/2021).

## 2020-12-13 ENCOUNTER — Other Ambulatory Visit (HOSPITAL_COMMUNITY): Payer: Self-pay | Admitting: Family Medicine

## 2020-12-13 MED FILL — MELOXICAM 15 MG TABLET: 15 | 90 days supply | Qty: 90 | Fill #0

## 2021-01-09 ENCOUNTER — Ambulatory Visit: Payer: 59 | Admitting: Podiatry

## 2021-01-09 ENCOUNTER — Other Ambulatory Visit: Payer: Self-pay

## 2021-01-09 DIAGNOSIS — D3610 Benign neoplasm of peripheral nerves and autonomic nervous system, unspecified: Secondary | ICD-10-CM

## 2021-01-12 ENCOUNTER — Encounter: Payer: Self-pay | Admitting: Podiatry

## 2021-01-12 NOTE — Progress Notes (Signed)
  Subjective:  Patient ID: ADAIJAH ENDRES, female    DOB: 10/08/1961,  MRN: [id number]  Chief Complaint  Patient presents with  . Neuroma    Right foot neuroma PT stated that she is doing better she still has some pain but it is not as bad    60 y.o. female returns with the above complaint. History confirmed with patient.  She had a good amount of improvement after the last injection, she thinks it helped a lot.  It is still painful but does not have that sharp intense pain that used to have  Objective:  Physical Exam: warm, good capillary refill, no trophic changes or ulcerative lesions, normal DP and PT pulses and normal sensory exam.   Right Foot: Pain still present with palpation in the plantar medial fourth interspace    Assessment:   1. Neuroma      Plan:  Patient was evaluated and treated and all questions answered.  She has had some improvement in the last injection with the plantar direct approach.  Recommend repeat injection today with corticosteroid today via plantar approach. Following sterile prep with alcohol, 0.5 cc of 2% lidocaine, 4 mg of dexamethasone phosphate and 10 mg of Kenalog was injected from a plantar approach into the area of maximal tenderness on the plantar foot along the lateral plantar 4th metatarsal. She tolerated the procedure well and was dressed with a Band-Aid.

## 2021-01-25 DIAGNOSIS — R1032 Left lower quadrant pain: Secondary | ICD-10-CM | POA: Diagnosis not present

## 2021-01-25 DIAGNOSIS — Z8742 Personal history of other diseases of the female genital tract: Secondary | ICD-10-CM | POA: Diagnosis not present

## 2021-02-20 ENCOUNTER — Encounter: Payer: Self-pay | Admitting: Podiatry

## 2021-02-20 ENCOUNTER — Ambulatory Visit: Payer: 59 | Admitting: Podiatry

## 2021-02-20 ENCOUNTER — Other Ambulatory Visit: Payer: Self-pay

## 2021-02-20 DIAGNOSIS — D3610 Benign neoplasm of peripheral nerves and autonomic nervous system, unspecified: Secondary | ICD-10-CM | POA: Diagnosis not present

## 2021-02-20 NOTE — Progress Notes (Signed)
  Subjective:  Patient ID: Laura Wall, female    DOB: 1961/02/18,  MRN: [id number]  Chief Complaint  Patient presents with  . Neuroma    6 week follow up right foot    59 y.o. female returns with the above complaint. History confirmed with patient.  Has not changed much since last visit.   Objective:  Physical Exam: warm, good capillary refill, no trophic changes or ulcerative lesions, normal DP and PT pulses and normal sensory exam.   Right Foot: Pain still present with palpation in the plantar medial fourth interspace    Assessment:   1. Neuroma      Plan:  Patient was evaluated and treated and all questions answered.  She has had some improvement in the last injection with the plantar direct approach.  Recommend repeat injection today with corticosteroid today via plantar approach. Following sterile prep with alcohol, 1cc of dilute dehydrated alcohol was was injected from a plantar approach into the area of maximal tenderness on the plantar foot along the lateral plantar 4th metatarsal. She tolerated the procedure well and was dressed with a Band-Aid.

## 2021-03-16 ENCOUNTER — Encounter (INDEPENDENT_AMBULATORY_CARE_PROVIDER_SITE_OTHER): Payer: Self-pay

## 2021-03-31 DIAGNOSIS — Z1231 Encounter for screening mammogram for malignant neoplasm of breast: Secondary | ICD-10-CM | POA: Diagnosis not present

## 2021-04-03 ENCOUNTER — Ambulatory Visit: Payer: 59 | Admitting: Podiatry

## 2021-04-03 ENCOUNTER — Other Ambulatory Visit: Payer: Self-pay

## 2021-04-03 DIAGNOSIS — D3610 Benign neoplasm of peripheral nerves and autonomic nervous system, unspecified: Secondary | ICD-10-CM

## 2021-04-03 DIAGNOSIS — G5761 Lesion of plantar nerve, right lower limb: Secondary | ICD-10-CM | POA: Diagnosis not present

## 2021-04-04 ENCOUNTER — Encounter: Payer: Self-pay | Admitting: Podiatry

## 2021-04-04 NOTE — Progress Notes (Signed)
  Subjective:  Patient ID: Laura Wall, female    DOB: 06/01/1961,  MRN: [id number]  Chief Complaint  Patient presents with  . Neuroma      neuroma injection    60 y.o. female returns with the above complaint. History confirmed with patient.  Has not changed much since last visit.  She is having more numbness  Objective:  Physical Exam: warm, good capillary refill, no trophic changes or ulcerative lesions, normal DP and PT pulses and normal sensory exam.   Right Foot: Pain still present with palpation in the plantar medial fourth interspace    Assessment:   1. Neuroma      Plan:  Patient was evaluated and treated and all questions answered.  She has had some improvement in the last injection with the plantar direct approach.  Recommend repeat injection today today via plantar approach. Following sterile prep with alcohol, 1cc of dilute dehydrated alcohol was was injected from a plantar approach into the area of maximal tenderness on the plantar foot along the lateral plantar 4th metatarsal. She tolerated the procedure well and was dressed with a Band-Aid.   We again discussed possible surgical treatment if this is not successful including excision of the nerve and implantation into bone or muscle to prevent stump neuroma formation.  Discussed the risk, benefits and potential complications of this.  She will consider this later in the summer or fall after her granddaughter is born

## 2021-05-02 ENCOUNTER — Ambulatory Visit: Payer: 59 | Admitting: Podiatry

## 2021-05-02 ENCOUNTER — Encounter: Payer: Self-pay | Admitting: Podiatry

## 2021-05-02 ENCOUNTER — Other Ambulatory Visit: Payer: Self-pay

## 2021-05-02 DIAGNOSIS — D3610 Benign neoplasm of peripheral nerves and autonomic nervous system, unspecified: Secondary | ICD-10-CM | POA: Diagnosis not present

## 2021-05-06 ENCOUNTER — Encounter: Payer: Self-pay | Admitting: Podiatry

## 2021-05-06 NOTE — Progress Notes (Signed)
  Subjective:  Patient ID: Laura Wall, female    DOB: 23-Jan-1961,  MRN: [id number]  Chief Complaint  Patient presents with   Neuroma    4 week follow up    60 y.o. female returns with the above complaint. History confirmed with patient.  Still about the same she is ready to schedule surgery  Objective:  Physical Exam: warm, good capillary refill, no trophic changes or ulcerative lesions, normal DP and PT pulses and normal sensory exam.   Right Foot: Pain still present with palpation in the plantar medial fourth interspace    Assessment:   1. Neuroma      Plan:  Patient was evaluated and treated and all questions answered.  She has had some improvement in the last injection with the plantar direct approach.  Recommend repeat injection today today via plantar approach. Following sterile prep with alcohol, 1cc of dilute dehydrated alcohol was was injected from a plantar approach into the area of maximal tenderness on the plantar foot along the lateral plantar 4th metatarsal. She tolerated the procedure well and was dressed with a Band-Aid.   She is ready to schedule surgical excision at this point.  We again discussed the risk, benefits and potential complications.  If we discussed the risks of infection, chronic pain, nerve damage or stump neuroma formation.  We discussed that these risks are rare but are possible.  I do think she will have significant improvement with excision.  Plan for excision of the fourth plantar interdigital Wall branch and implantation into interosseous muscle belly.  Informed consent was signed and reviewed.  Surgery scheduled for next month.   Surgical plan:  Procedure: -Peripheral neurectomy with implantation into muscle  Location: -[facility]  Anesthesia plan: -IV sedation with local anesthesia  Postoperative pain plan: - Tylenol 1000 mg every 6 hours, ibuprofen 600 mg every 6 hours, gabapentin 300 mg every 8 hours x5 days, oxycodone 5 mg 1-2 tabs  every 6 hours only as needed  DVT prophylaxis: -None required  WB Restrictions / DME needs: -WBAT in surgical shoe

## 2021-05-10 ENCOUNTER — Telehealth: Payer: Self-pay | Admitting: Urology

## 2021-05-10 NOTE — Telephone Encounter (Signed)
DOS - 06/09/21  SUTURE OF NERVE RIGHT --- 27639 IMPLANTATION OF NERVE END INTO BONE RIGHT --- 43200   [facility] EFFECTIVE DATE - 10/29/20   PLAN DEDUCTIBLE - $300 W/ $0.00 OUT OF POCKET - $7900.00 W/ $7,520.00 REMAINING COINSURANCE - 40% COPAY - $0.00   SPOKE WITH DENISE WITH [facility] AND SHE STATED THAT NOR PRIOR AUTH IS REQUIRED FOR CPT CODES 37944 AND 46190.   REF # [id number]

## 2021-05-24 ENCOUNTER — Other Ambulatory Visit (HOSPITAL_COMMUNITY): Payer: Self-pay

## 2021-05-24 ENCOUNTER — Other Ambulatory Visit: Payer: Self-pay

## 2021-05-25 ENCOUNTER — Other Ambulatory Visit: Payer: Self-pay

## 2021-05-25 ENCOUNTER — Other Ambulatory Visit (HOSPITAL_COMMUNITY): Payer: Self-pay

## 2021-05-25 MED ORDER — MELOXICAM 15 MG PO TABS
15.0000 mg | ORAL_TABLET | Freq: Every day | ORAL | 0 refills | Status: DC
  Filled 2021-05-25: qty 90, 90d supply, fill #0

## 2021-05-26 ENCOUNTER — Other Ambulatory Visit (HOSPITAL_COMMUNITY): Payer: Self-pay

## 2021-05-29 ENCOUNTER — Other Ambulatory Visit (HOSPITAL_COMMUNITY): Payer: Self-pay

## 2021-05-29 MED ORDER — INTEGRA PLUS PO CAPS
1.0000 | ORAL_CAPSULE | Freq: Every day | ORAL | 0 refills | Status: DC
  Filled 2021-05-29: qty 90, 90d supply, fill #0

## 2021-06-01 ENCOUNTER — Other Ambulatory Visit (HOSPITAL_COMMUNITY): Payer: Self-pay

## 2021-06-07 ENCOUNTER — Encounter: Payer: Self-pay | Admitting: Podiatry

## 2021-06-08 ENCOUNTER — Other Ambulatory Visit (HOSPITAL_COMMUNITY): Payer: Self-pay

## 2021-06-08 ENCOUNTER — Other Ambulatory Visit: Payer: Self-pay | Admitting: Podiatry

## 2021-06-08 DIAGNOSIS — Z872 Personal history of diseases of the skin and subcutaneous tissue: Secondary | ICD-10-CM | POA: Diagnosis not present

## 2021-06-08 DIAGNOSIS — L821 Other seborrheic keratosis: Secondary | ICD-10-CM | POA: Diagnosis not present

## 2021-06-08 DIAGNOSIS — D485 Neoplasm of uncertain behavior of skin: Secondary | ICD-10-CM | POA: Diagnosis not present

## 2021-06-08 MED ORDER — GABAPENTIN 300 MG PO CAPS
300.0000 mg | ORAL_CAPSULE | Freq: Three times a day (TID) | ORAL | 0 refills | Status: DC
  Filled 2021-06-08: qty 21, 7d supply, fill #0

## 2021-06-08 MED ORDER — OXYCODONE HCL 5 MG PO TABS
5.0000 mg | ORAL_TABLET | ORAL | 0 refills | Status: AC | PRN
  Filled 2021-06-08: qty 20, 7d supply, fill #0

## 2021-06-08 MED ORDER — IBUPROFEN 600 MG PO TABS
600.0000 mg | ORAL_TABLET | Freq: Four times a day (QID) | ORAL | 0 refills | Status: AC | PRN
  Filled 2021-06-08: qty 56, 14d supply, fill #0

## 2021-06-08 MED ORDER — ACETAMINOPHEN 500 MG PO TABS
1000.0000 mg | ORAL_TABLET | Freq: Four times a day (QID) | ORAL | 0 refills | Status: AC | PRN
  Filled 2021-06-08: qty 112, 14d supply, fill #0

## 2021-06-09 DIAGNOSIS — G5761 Lesion of plantar nerve, right lower limb: Secondary | ICD-10-CM | POA: Diagnosis not present

## 2021-06-15 ENCOUNTER — Ambulatory Visit (INDEPENDENT_AMBULATORY_CARE_PROVIDER_SITE_OTHER): Payer: 59 | Admitting: Podiatry

## 2021-06-15 ENCOUNTER — Other Ambulatory Visit: Payer: Self-pay

## 2021-06-15 DIAGNOSIS — D3610 Benign neoplasm of peripheral nerves and autonomic nervous system, unspecified: Secondary | ICD-10-CM

## 2021-06-20 ENCOUNTER — Encounter: Payer: Self-pay | Admitting: Podiatry

## 2021-06-20 NOTE — Progress Notes (Signed)
  Subjective:  Patient ID: Laura Wall, female    DOB: 07/30/1961,  MRN: [id number]  Chief Complaint  Patient presents with   Routine Post Op      POV #1 DOS 06/09/2021 NEURECTOMY RT 4TH INTERSPACE     60 y.o. female returns for post-op check.  Overall doing well she has had some intermittent sharp shooting pain  Review of Systems: Negative except as noted in the HPI. Denies N/V/F/Ch.   Objective:  There were no vitals filed for this visit. There is no height or weight on file to calculate BMI. Constitutional Well developed. Well nourished.  Vascular Foot warm and well perfused. Capillary refill normal to all digits.   Neurologic Normal speech. Oriented to person, place, and time. Epicritic sensation to light touch grossly present bilaterally.  Dermatologic Skin healing well without signs of infection. Skin edges well coapted without signs of infection.  Orthopedic: Tenderness to palpation noted about the surgical site.    Assessment:  No diagnosis found. Plan:  Patient was evaluated and treated and all questions answered.  S/p foot surgery right -Progressing as expected post-operatively. -XR: None taken -WB Status: WBAT in surgical shoe -Sutures: Removed at next visit.  She may begin bathing -Medications: No refills required -Foot redressed.  Return in about 2 weeks (around 06/29/2021) for suture removal, post op (no x-rays).

## 2021-06-28 ENCOUNTER — Encounter: Payer: Self-pay | Admitting: Podiatry

## 2021-06-28 ENCOUNTER — Other Ambulatory Visit (HOSPITAL_COMMUNITY): Payer: Self-pay

## 2021-06-28 DIAGNOSIS — F900 Attention-deficit hyperactivity disorder, predominantly inattentive type: Secondary | ICD-10-CM | POA: Diagnosis not present

## 2021-06-28 MED ORDER — AMPHETAMINE-DEXTROAMPHET ER 20 MG PO CP24
20.0000 mg | ORAL_CAPSULE | Freq: Every morning | ORAL | 0 refills | Status: DC
  Filled 2021-06-28: qty 30, 30d supply, fill #0

## 2021-06-29 ENCOUNTER — Other Ambulatory Visit (HOSPITAL_COMMUNITY): Payer: Self-pay

## 2021-06-29 ENCOUNTER — Other Ambulatory Visit: Payer: Self-pay

## 2021-06-29 ENCOUNTER — Ambulatory Visit (INDEPENDENT_AMBULATORY_CARE_PROVIDER_SITE_OTHER): Payer: 59 | Admitting: Podiatry

## 2021-06-29 DIAGNOSIS — D3610 Benign neoplasm of peripheral nerves and autonomic nervous system, unspecified: Secondary | ICD-10-CM

## 2021-06-29 DIAGNOSIS — Z9889 Other specified postprocedural states: Secondary | ICD-10-CM

## 2021-07-04 NOTE — Progress Notes (Signed)
  Subjective:  Patient ID: Laura Wall, female    DOB: 01-01-1961,  MRN: [id number]  Chief Complaint  Patient presents with   Routine Post Op    POV #2 DOS 06/09/2021 NEURECTOMY RT 4TH INTERSPACE     60 y.o. female returns for post-op check.  Doing well pain is improving less shooting pain now  Review of Systems: Negative except as noted in the HPI. Denies N/V/F/Ch.   Objective:  There were no vitals filed for this visit. There is no height or weight on file to calculate BMI. Constitutional Well developed. Well nourished.  Vascular Foot warm and well perfused. Capillary refill normal to all digits.   Neurologic Normal speech. Oriented to person, place, and time. Epicritic sensation to light touch grossly present bilaterally.  Dermatologic Skin healing well without signs of infection. Skin edges well coapted without signs of infection.  Orthopedic: Tenderness to palpation noted about the surgical site.    Assessment:   1. Neuroma   2. Post-operative state    Plan:  Patient was evaluated and treated and all questions answered.  S/p foot surgery right -Sutures removed uneventfully.  She can transition to regular shoe gear as tolerated.  We will reevaluate in a few weeks  No follow-ups on file.

## 2021-07-20 ENCOUNTER — Ambulatory Visit (INDEPENDENT_AMBULATORY_CARE_PROVIDER_SITE_OTHER): Payer: 59 | Admitting: Podiatry

## 2021-07-20 ENCOUNTER — Other Ambulatory Visit: Payer: Self-pay

## 2021-07-20 DIAGNOSIS — D3610 Benign neoplasm of peripheral nerves and autonomic nervous system, unspecified: Secondary | ICD-10-CM

## 2021-07-20 DIAGNOSIS — Z9889 Other specified postprocedural states: Secondary | ICD-10-CM

## 2021-07-25 DIAGNOSIS — Z0000 Encounter for general adult medical examination without abnormal findings: Secondary | ICD-10-CM | POA: Diagnosis not present

## 2021-07-25 DIAGNOSIS — F988 Other specified behavioral and emotional disorders with onset usually occurring in childhood and adolescence: Secondary | ICD-10-CM | POA: Diagnosis not present

## 2021-07-25 DIAGNOSIS — Z131 Encounter for screening for diabetes mellitus: Secondary | ICD-10-CM | POA: Diagnosis not present

## 2021-07-25 DIAGNOSIS — E7800 Pure hypercholesterolemia, unspecified: Secondary | ICD-10-CM | POA: Diagnosis not present

## 2021-07-25 DIAGNOSIS — R42 Dizziness and giddiness: Secondary | ICD-10-CM | POA: Diagnosis not present

## 2021-07-25 NOTE — Progress Notes (Signed)
  Subjective:  Patient ID: Laura Wall, female    DOB: Oct 08, 1961,  MRN: [id number]  Chief Complaint  Patient presents with   Routine Post Op    POV #3 DOS 06/09/2021 NEURECTOMY RT 4TH INTERSPACE     60 y.o. female returns for post-op check.  Still has some tenderness overall improving and is not the same type of pain she had before surgery.  Feels like there is a knot in the bottom of the foot between the toes and the scar is thick.  The delayed healing is improving but some scab still remains  Review of Systems: Negative except as noted in the HPI. Denies N/V/F/Ch.   Objective:  There were no vitals filed for this visit. There is no height or weight on file to calculate BMI. Constitutional Well developed. Well nourished.  Vascular Foot warm and well perfused. Capillary refill normal to all digits.   Neurologic Normal speech. Oriented to person, place, and time. Epicritic sensation to light touch grossly present bilaterally.  Dermatologic Superficial scab remains from delayed healing  Orthopedic: Tenderness to palpation noted about the surgical site.  Scar tissue present in the interspace    Assessment:   1. Neuroma   2. Post-operative state     Plan:  Patient was evaluated and treated and all questions answered.  S/p foot surgery right -Overall progressing I think most of the pain and tenderness she has is due to scar tissue in the joint.  I think she can resume most of all her activities including her Pilates.  Range of motion be good to manipulate this and continue to prevent scar tissue formation.  Hopefully will be near 100% by next visit  Return in about 6 weeks (around 08/31/2021) for post op (no x-rays).

## 2021-08-15 DIAGNOSIS — Z1239 Encounter for other screening for malignant neoplasm of breast: Secondary | ICD-10-CM | POA: Diagnosis not present

## 2021-08-15 DIAGNOSIS — Z01419 Encounter for gynecological examination (general) (routine) without abnormal findings: Secondary | ICD-10-CM | POA: Diagnosis not present

## 2021-08-15 DIAGNOSIS — Z6826 Body mass index (BMI) 26.0-26.9, adult: Secondary | ICD-10-CM | POA: Diagnosis not present

## 2021-08-15 DIAGNOSIS — Z1211 Encounter for screening for malignant neoplasm of colon: Secondary | ICD-10-CM | POA: Diagnosis not present

## 2021-08-31 ENCOUNTER — Other Ambulatory Visit: Payer: Self-pay

## 2021-08-31 ENCOUNTER — Ambulatory Visit (INDEPENDENT_AMBULATORY_CARE_PROVIDER_SITE_OTHER): Payer: 59 | Admitting: Podiatry

## 2021-08-31 DIAGNOSIS — D3610 Benign neoplasm of peripheral nerves and autonomic nervous system, unspecified: Secondary | ICD-10-CM

## 2021-09-04 ENCOUNTER — Encounter: Payer: Self-pay | Admitting: Podiatry

## 2021-09-04 NOTE — Progress Notes (Signed)
  Subjective:  Patient ID: Laura Wall, female    DOB: [birth year],  MRN: [id number]  Chief Complaint  Patient presents with   Routine Post Op     POV #3 DOS 06/09/2021 NEURECTOMY RT 4TH INTERSPACE     60 y.o. female returns for post-op check.  She is doing very well she is almost nearly pain-free.  She did have some pain while on her foot for extended periods of time while traveling but overall back to full activity  Review of Systems: Negative except as noted in the HPI. Denies N/V/F/Ch.   Objective:  There were no vitals filed for this visit. There is no height or weight on file to calculate BMI. Constitutional Well developed. Well nourished.  Vascular Foot warm and well perfused. Capillary refill normal to all digits.   Neurologic Normal speech. Oriented to person, place, and time. Epicritic sensation to light touch grossly present bilaterally.  Dermatologic Scar slightly hypertrophic  Orthopedic: Minimal tenderness scar tissue is improved but still present    Assessment:   1. Neuroma     Plan:  Patient was evaluated and treated and all questions answered.  S/p foot surgery right -Overall doing very well and satisfied with her surgery.  I think intermittent pains will continue to resolve over time and scar tissue will continue to thin out advised to continue to stay active and work out with scar tissue.  She will return to see me as needed for this or other issues  Return if symptoms worsen or fail to improve.

## 2021-09-15 ENCOUNTER — Encounter: Payer: Self-pay | Admitting: Plastic Surgery

## 2021-09-15 ENCOUNTER — Other Ambulatory Visit: Payer: Self-pay

## 2021-09-15 ENCOUNTER — Ambulatory Visit (INDEPENDENT_AMBULATORY_CARE_PROVIDER_SITE_OTHER): Payer: Self-pay | Admitting: Plastic Surgery

## 2021-09-15 DIAGNOSIS — Z719 Counseling, unspecified: Secondary | ICD-10-CM

## 2021-09-15 NOTE — Progress Notes (Signed)

## 2021-10-17 ENCOUNTER — Telehealth (INDEPENDENT_AMBULATORY_CARE_PROVIDER_SITE_OTHER): Payer: 59 | Admitting: Plastic Surgery

## 2021-10-17 ENCOUNTER — Other Ambulatory Visit (HOSPITAL_COMMUNITY): Payer: Self-pay

## 2021-10-17 DIAGNOSIS — Z719 Counseling, unspecified: Secondary | ICD-10-CM

## 2021-10-17 MED ORDER — VALACYCLOVIR HCL 500 MG PO TABS
500.0000 mg | ORAL_TABLET | Freq: Two times a day (BID) | ORAL | 0 refills | Status: AC
  Filled 2021-10-17 – 2021-10-25 (×3): qty 10, 5d supply, fill #0

## 2021-10-18 ENCOUNTER — Telehealth: Payer: Self-pay | Admitting: Plastic Surgery

## 2021-10-18 NOTE — Telephone Encounter (Signed)
Spoke to patient and the cream was found.

## 2021-10-18 NOTE — Telephone Encounter (Signed)
Patient said that Dr. Marla Roe messaged her and said that the system acted up when she went to submit one of her prescriptions and was not sure exactly where it was sent.  Patient called and was wondering if this is something we could look into for her? Is there a way we can see where the prescription was sent?  Please follow up

## 2021-10-19 ENCOUNTER — Other Ambulatory Visit (HOSPITAL_COMMUNITY): Payer: Self-pay

## 2021-10-19 NOTE — Telephone Encounter (Signed)
[facility]alo scheduled

## 2021-10-25 ENCOUNTER — Other Ambulatory Visit (HOSPITAL_COMMUNITY): Payer: Self-pay

## 2021-10-31 ENCOUNTER — Telehealth: Payer: Self-pay

## 2021-10-31 NOTE — Telephone Encounter (Signed)
Patient has a laser appointment scheduled with Dr. Marla Roe for 11/16/2021.  Patient said she has the Acyclovir and would like to know when she should start taking it.  Please call.

## 2021-11-01 NOTE — Telephone Encounter (Signed)
Pt informed and conveyed understanding.

## 2021-11-02 ENCOUNTER — Other Ambulatory Visit: Payer: 59 | Admitting: Plastic Surgery

## 2021-11-05 ENCOUNTER — Encounter: Payer: Self-pay | Admitting: Gastroenterology

## 2021-11-10 DIAGNOSIS — L821 Other seborrheic keratosis: Secondary | ICD-10-CM | POA: Diagnosis not present

## 2021-11-10 DIAGNOSIS — D2272 Melanocytic nevi of left lower limb, including hip: Secondary | ICD-10-CM | POA: Diagnosis not present

## 2021-11-10 DIAGNOSIS — D2230 Melanocytic nevi of unspecified part of face: Secondary | ICD-10-CM | POA: Diagnosis not present

## 2021-11-10 DIAGNOSIS — D224 Melanocytic nevi of scalp and neck: Secondary | ICD-10-CM | POA: Diagnosis not present

## 2021-11-10 DIAGNOSIS — L578 Other skin changes due to chronic exposure to nonionizing radiation: Secondary | ICD-10-CM | POA: Diagnosis not present

## 2021-11-10 DIAGNOSIS — L818 Other specified disorders of pigmentation: Secondary | ICD-10-CM | POA: Diagnosis not present

## 2021-11-10 DIAGNOSIS — Z86018 Personal history of other benign neoplasm: Secondary | ICD-10-CM | POA: Diagnosis not present

## 2021-11-10 DIAGNOSIS — Z85828 Personal history of other malignant neoplasm of skin: Secondary | ICD-10-CM | POA: Diagnosis not present

## 2021-11-10 DIAGNOSIS — D225 Melanocytic nevi of trunk: Secondary | ICD-10-CM | POA: Diagnosis not present

## 2021-11-16 ENCOUNTER — Encounter: Payer: Self-pay | Admitting: Surgical

## 2021-11-16 ENCOUNTER — Ambulatory Visit (INDEPENDENT_AMBULATORY_CARE_PROVIDER_SITE_OTHER): Payer: Self-pay | Admitting: Surgical

## 2021-11-16 ENCOUNTER — Other Ambulatory Visit (HOSPITAL_COMMUNITY): Payer: Self-pay

## 2021-11-16 ENCOUNTER — Other Ambulatory Visit: Payer: Self-pay

## 2021-11-16 VITALS — BP 131/74 | HR 72 | Ht 70.0 in | Wt 171.0 lb

## 2021-11-16 DIAGNOSIS — Z719 Counseling, unspecified: Secondary | ICD-10-CM

## 2021-11-16 MED ORDER — HYDROCODONE-ACETAMINOPHEN 5-325 MG PO TABS
1.0000 | ORAL_TABLET | Freq: Two times a day (BID) | ORAL | 0 refills | Status: AC | PRN
  Filled 2021-11-16: qty 6, 3d supply, fill #0

## 2021-11-16 NOTE — Addendum Note (Signed)
Addended byRoetta Sessions on: 11/16/2021 04:22 PM   Modules accepted: Orders

## 2021-11-16 NOTE — Progress Notes (Signed)
Preoperative Dx: hyperpigmentation, fine lines/wrinkles  Postoperative Dx:  same  Procedure: BBL laser and [place] Halo Laser to face   Anesthesia: Topical lidocaine and tetracaine, 23% lidocaine, 7% tetracaine  Description of Procedure:  Risks and complications were explained to the patient. Consent was confirmed and signed. Time out was called and all information was confirmed to be correct. The area  area was prepped with alcohol and wiped dry.   The BBL laser was set at 6 J/cm2. The face was lasered. The patient tolerated the procedure well and there were no complications.  We then moved onto the halo laser treatment.  HALO Treatment   Treatment  Settings:       1470 nm : 300 microns @ 20 %     2940 nm : 20 microns @ 15 %      Zone  Area (cm2) Target Energy (Joules) Delivered Energy (J) 1470 Depth (Micron) 1470 Density (%) 2940 Depth (M) 2940 Density (%)  1 73 743 758 300 20 20 15  2 73 743 758 300 20 20 15  3 30 312 312 300 20 20 15  4  30 312 320 300 20 20 15  5 17  179 179 300 20 20 15    6  (neck) 102 1040 1038 300  20 15    Post Care: Patient was provided with post care instructions via MyChart message.  Discussed with patient I would resend message today.  We discussed post care instructions including moisturizing daily, multiple times per day.  Avoid sun exposure.  Avoid scratching or removing MENDs

## 2021-11-17 ENCOUNTER — Other Ambulatory Visit (HOSPITAL_COMMUNITY): Payer: Self-pay

## 2021-11-23 ENCOUNTER — Other Ambulatory Visit (HOSPITAL_COMMUNITY): Payer: Self-pay

## 2021-11-23 MED ORDER — FOLIVANE-PLUS PO CAPS
1.0000 | ORAL_CAPSULE | Freq: Every day | ORAL | 3 refills | Status: DC
  Filled 2021-11-23: qty 90, 90d supply, fill #0

## 2021-11-23 MED ORDER — MELOXICAM 15 MG PO TABS
15.0000 mg | ORAL_TABLET | Freq: Every day | ORAL | 0 refills | Status: DC
  Filled 2021-11-23: qty 90, 90d supply, fill #0

## 2021-11-24 ENCOUNTER — Other Ambulatory Visit (HOSPITAL_COMMUNITY): Payer: Self-pay

## 2021-11-27 ENCOUNTER — Encounter: Payer: Self-pay | Admitting: Gastroenterology

## 2021-11-29 DIAGNOSIS — H524 Presbyopia: Secondary | ICD-10-CM | POA: Diagnosis not present

## 2021-11-29 DIAGNOSIS — H5203 Hypermetropia, bilateral: Secondary | ICD-10-CM | POA: Diagnosis not present

## 2021-11-29 DIAGNOSIS — H52223 Regular astigmatism, bilateral: Secondary | ICD-10-CM | POA: Diagnosis not present

## 2021-11-30 ENCOUNTER — Other Ambulatory Visit (HOSPITAL_COMMUNITY): Payer: Self-pay

## 2021-11-30 MED ORDER — FML FORTE 0.25 % OP SUSP
1.0000 [drp] | Freq: Four times a day (QID) | OPHTHALMIC | 0 refills | Status: DC
  Filled 2021-11-30: qty 10, 50d supply, fill #0
  Filled 2021-12-06: qty 10, 30d supply, fill #0

## 2021-12-06 ENCOUNTER — Other Ambulatory Visit (HOSPITAL_COMMUNITY): Payer: Self-pay

## 2021-12-06 MED ORDER — PREDNISOLONE ACETATE 1 % OP SUSP
1.0000 [drp] | Freq: Four times a day (QID) | OPHTHALMIC | 0 refills | Status: DC
  Filled 2021-12-06: qty 5, 13d supply, fill #0

## 2021-12-29 ENCOUNTER — Other Ambulatory Visit (HOSPITAL_COMMUNITY): Payer: Self-pay

## 2021-12-29 ENCOUNTER — Ambulatory Visit (AMBULATORY_SURGERY_CENTER): Payer: 59 | Admitting: *Deleted

## 2021-12-29 ENCOUNTER — Other Ambulatory Visit: Payer: Self-pay

## 2021-12-29 VITALS — Ht 70.0 in | Wt 168.0 lb

## 2021-12-29 DIAGNOSIS — Z86010 Personal history of colonic polyps: Secondary | ICD-10-CM

## 2021-12-29 MED ORDER — NA SULFATE-K SULFATE-MG SULF 17.5-3.13-1.6 GM/177ML PO SOLN
2.0000 | Freq: Once | ORAL | 0 refills | Status: AC
  Filled 2021-12-29 – 2022-01-09 (×2): qty 354, 1d supply, fill #0

## 2021-12-29 NOTE — Progress Notes (Signed)

## 2022-01-08 ENCOUNTER — Other Ambulatory Visit (HOSPITAL_COMMUNITY): Payer: Self-pay

## 2022-01-09 ENCOUNTER — Other Ambulatory Visit (HOSPITAL_COMMUNITY): Payer: Self-pay

## 2022-01-09 ENCOUNTER — Encounter: Payer: Self-pay | Admitting: Gastroenterology

## 2022-01-10 ENCOUNTER — Other Ambulatory Visit (HOSPITAL_COMMUNITY): Payer: Self-pay

## 2022-01-15 ENCOUNTER — Ambulatory Visit (AMBULATORY_SURGERY_CENTER): Payer: 59 | Admitting: Gastroenterology

## 2022-01-15 ENCOUNTER — Encounter: Payer: Self-pay | Admitting: Gastroenterology

## 2022-01-15 VITALS — BP 112/52 | HR 63 | Temp 98.7°F | Resp 12 | Ht 70.0 in | Wt 168.0 lb

## 2022-01-15 DIAGNOSIS — Z86010 Personal history of colonic polyps: Secondary | ICD-10-CM

## 2022-01-15 DIAGNOSIS — Z1211 Encounter for screening for malignant neoplasm of colon: Secondary | ICD-10-CM | POA: Diagnosis not present

## 2022-01-15 HISTORY — PX: COLONOSCOPY WITH PROPOFOL: SHX5780

## 2022-01-15 MED ORDER — SODIUM CHLORIDE 0.9 % IV SOLN: 500.0000 mL | Freq: Once | INTRAVENOUS | Status: DC

## 2022-01-15 NOTE — Progress Notes (Signed)
HPI: ?This is a woman with h/o polyps ? ?Colonoscopy 214 Dr. Ardis Hughs 1.1cm TVA.  Colonoscopy 10/2016 two subCM polyps (1 TA and 1 SSP) ? ? ?ROS: complete GI ROS as described in HPI, all other review negative. ? ?Constitutional:  No unintentional weight loss ? ? ?Past Medical History:  ?Diagnosis Date  ? 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 Complication of anesthesia   ? woke up during some surgeries  ? Frequency of urination   ? History of kidney stones   ? Iron deficiency anemia   ? Seasonal allergies   ? SUI (stress urinary incontinence, female)   ? Toe fracture, left   ? 3rd toe  ? Ureteral stone pt states stone embedded in urethral sling  ? Urgency of urination   ? ? ?Past Surgical History:  ?Procedure Laterality Date  ? ABDOMINAL HYSTERECTOMY    ? BENIGN LEFT BREAST LUMPECTOMY  1985  ? BREAST LUMPECTOMY WITH RADIOACTIVE SEED LOCALIZATION Right 10/07/2014  ? Procedure: RIGHT BREAST LUMPECTOMY WITH RADIOACTIVE SEED LOCALIZATION;  Surgeon: Rolm Bookbinder, MD;  Location: [place];  Service: General;  Laterality: Right;  ? BREAST RECONSTRUCTION Bilateral 09/2014  ? BREAST REDUCTION SURGERY Bilateral 10/07/2014  ? Procedure: BILATERAL MAMMARY REDUCTION  (BREAST);  Surgeon: Theodoro Kos, DO;  Location: [place];  Service: Plastics;  Laterality: Bilateral;  ? COLD KNIFE CERVICAL CONIZATION  1982  ? COLONOSCOPY    ? COLONOSCOPY WITH PROPOFOL  01/15/2022  ? CYSTOSCOPY  08/29/2012  ? Procedure: CYSTOSCOPY;  Surgeon: Alexis Frock, MD;  Location: [facility];  Service: Urology;  Laterality: N/A;  ? ECTOPIC PREGNANCY SURGERY  2003  ? BILATERAL FIMBRIECTOMIES (TUBAL RUPTURE)  ? EXCISION MELANOMA LESION RIGHT TOE  2009  ? HOLMIUM LASER APPLICATION  86/76/7209  ? Procedure: HOLMIUM LASER APPLICATION;  Surgeon: Alexis Frock, MD;  Location: [facility];  Service: Urology;  Laterality: N/A;  There is no left or right designated because the stone is embedded in  the urethral sling  ? [place] ENDOMETRIAL ABLATION  2008  ? AND URETHRAL SLING PLACEMENT  ? LIPOSUCTION Bilateral 10/07/2014  ? Procedure: LIPOSUCTION;  Surgeon: Theodoro Kos, DO;  Location: [place];  Service: Plastics;  Laterality: Bilateral;  ? [place] SURGERY  2022  ? ORIF TOE FRACTURE Left 01/12/2016  ? Procedure: OPEN REDUCTION INTERNAL FIXATION (ORIF) LEFT THIRD TOE FRACTURE;  Surgeon: Wylene Simmer, MD;  Location: [place];  Service: Orthopedics;  Laterality: Left;  ? ROBOTIC-ASSISTED TOTAL HYSTERECOTMY W/ LEFT OVARIAN CYSTECTOMY  01-14-2009  DR RIVARD  ? CHRONIC PELVIC PAIN/ UTERINE FIBROIDS/ ENDOMETRIOSIS/ ANEMIA  ? SACROPLASTY  2021  ? TUBAL LIGATION  2003  ? BILATERAL  ? ? ?Current Outpatient Medications  ?Medication Sig Dispense Refill  ? meloxicam (MOBIC) 15 MG tablet Take 1 tablet (15 mg total) by mouth daily. 90 tablet 0  ? VITAMIN D, CHOLECALCIFEROL, PO Vitamin D    ? amphetamine-dextroamphetamine (ADDERALL XR) 20 MG 24 hr capsule Take 1 capsule (20 mg total) by mouth in the morning. 30 capsule 0  ? conjugated estrogens (PREMARIN) vaginal cream Insert pea-sized (0.5g) amount into vagina every other night    ? diazepam (VALIUM) 10 MG tablet as needed (vertigo).    ? FeFum-FePoly-FA-B Cmp-C-Biot (FOLIVANE-PLUS) CAPS Take 1 capsule by mouth daily. 90 capsule 3  ? prednisoLONE acetate (PREDNISOLONE ACETATE P-F) 1 % ophthalmic suspension Place 1 drop into both eyes 4 (  four) times daily. 5 mL 0  ? ?Current Facility-Administered Medications  ?Medication Dose Route Frequency Provider Last Rate Last Admin  ? 0.9 %  sodium chloride infusion  500 mL Intravenous Once Milus Banister, MD      ? ? ?Allergies as of 01/15/2022 - Review Complete 01/15/2022  ?Allergen Reaction Noted  ? Codeine Nausea And Vomiting 07/10/2012  ? Latex Rash 08/26/2012  ? Penicillins Rash 06/05/2010  ? Sulfa antibiotics Rash 08/26/2012  ? ? ?Family History  ?Problem Relation Age of  Onset  ? Cancer Mother   ?     multiple myeloma  ? Cancer Father 47  ?     lung  ? Breast cancer Paternal Aunt   ? Breast cancer Paternal Aunt   ? Breast cancer Paternal Aunt   ? Colon cancer Neg Hx   ? Colon polyps Neg Hx   ? Esophageal cancer Neg Hx   ? Rectal cancer Neg Hx   ? Stomach cancer Neg Hx   ? ? ?Social History  ? ?Socioeconomic History  ? Marital status: Married  ?  Spouse name: Not on file  ? Number of children: Not on file  ? Years of education: Not on file  ? Highest education level: Not on file  ?Occupational History  ? Not on file  ?Tobacco Use  ? Smoking status: Former  ?  Packs/day: 3.00  ?  Years: 10.00  ?  Pack years: 30.00  ?  Types: Cigarettes  ?  Quit date: 08/26/1992  ?  Years since quitting: 29.4  ? Smokeless tobacco: Never  ?Vaping Use  ? Vaping Use: Never used  ?Substance and Sexual Activity  ? Alcohol use: Yes  ?  Alcohol/week: 2.0 standard drinks  ?  Types: 2 Glasses of wine per week  ?  Comment: social  ? Drug use: No  ? Sexual activity: Yes  ?  Birth control/protection: Surgical, Post-menopausal  ?Other Topics Concern  ? Not on file  ?Social History Narrative  ? Not on file  ? ?Social Determinants of Health  ? ?Financial Resource Strain: Not on file  ?Food Insecurity: Not on file  ?Transportation Needs: Not on file  ?Physical Activity: Not on file  ?Stress: Not on file  ?Social Connections: Not on file  ?Intimate Partner Violence: Not on file  ? ? ? ?Physical Exam: ?BP 131/69   Pulse 72   Temp 98.7 ?F (37.1 ?C) (Temporal)   Ht 5' 10"  (1.778 m)   Wt 168 lb (76.2 kg)   SpO2 97%   BMI 24.11 kg/m?  ?Constitutional: generally well-appearing ?Psychiatric: alert and oriented x3 ?Lungs: CTA bilaterally ?Heart: no MCR ? ?Assessment and plan: ?61 y.o. female with h/o precancerous polyps ? ?Surveillance colonoscopy today ? ?Care is appropriate for the ambulatory setting. ? ?Owens Loffler, MD ?[facility] ?01/15/2022, 9:13 AM ? ? ? ?

## 2022-01-15 NOTE — Patient Instructions (Signed)
Read al of the handouts given to you by your recovery room nurse. ? ?YOU HAD AN ENDOSCOPIC PROCEDURE TODAY AT [facility]:   Refer to the procedure report that was given to you for any specific questions about what was found during the examination.  If the procedure report does not answer your questions, please call your gastroenterologist to clarify.  If you requested that your care partner not be given the details of your procedure findings, then the procedure report has been included in a sealed envelope for you to review at your convenience later. ? ?YOU SHOULD EXPECT: Some feelings of bloating in the abdomen. Passage of more gas than usual.  Walking can help get rid of the air that was put into your GI tract during the procedure and reduce the bloating. If you had a lower endoscopy (such as a colonoscopy or flexible sigmoidoscopy) you may notice spotting of blood in your stool or on the toilet paper. If you underwent a bowel prep for your procedure, you may not have a normal bowel movement for a few days. ? ?Please Note:  You might notice some irritation and congestion in your nose or some drainage.  This is from the oxygen used during your procedure.  There is no need for concern and it should clear up in a day or so. ? ?SYMPTOMS TO REPORT IMMEDIATELY: ? ?Following lower endoscopy (colonoscopy or flexible sigmoidoscopy): ? Excessive amounts of blood in the stool ? Significant tenderness or worsening of abdominal pains ? Swelling of the abdomen that is new, acute ? Fever of 100?F or higher ? ? ?For urgent or emergent issues, a gastroenterologist can be reached at any hour by calling [phone]. ?Do not use MyChart messaging for urgent concerns.  ? ? ?DIET:  We do recommend a small meal at first, but then you may proceed to your regular diet.  Drink plenty of fluids but you should avoid alcoholic beverages for 24 hours. ?Try to eat a high fiber diet, and drink plenty of water. ? ?ACTIVITY:   You should plan to take it easy for the rest of today and you should NOT DRIVE or use heavy machinery until tomorrow (because of the sedation medicines used during the test).   ? ?FOLLOW UP: ?Our staff will call the number listed on your records 48-72 hours following your procedure to check on you and address any questions or concerns that you may have regarding the information given to you following your procedure. If we do not reach you, we will leave a message.  We will attempt to reach you two times.  During this call, we will ask if you have developed any symptoms of COVID 19. If you develop any symptoms (ie: fever, flu-like symptoms, shortness of breath, cough etc.) before then, please call [phone].  If you test positive for Covid 19 in the 2 weeks post procedure, please call and report this information to [place].   ? ?If any biopsies were taken you will be contacted by phone or by letter within the next 1-3 weeks.  Please call us at [phone] if you have not heard about the biopsies in 3 weeks.  ? ? ?SIGNATURES/CONFIDENTIALITY: ?You and/or your care partner have signed paperwork which will be entered into your electronic medical record.  These signatures attest to the fact that that the information above on your After Visit Summary has been reviewed and is understood.  Full responsibility of the confidentiality of this discharge  information lies with you and/or your care-partner.  ?

## 2022-01-15 NOTE — Progress Notes (Signed)
Pt's states no medical or surgical changes since previsit or office visit.  VS CW  

## 2022-01-15 NOTE — Op Note (Signed)
[facility] ?Patient Name: Laura Wall ?Procedure Date: 01/15/2022 9:16 AM ?MRN: [id number] ?Endoscopist: Milus Banister , MD ?Age: 61 ?Referring MD:  ?Date of Birth: Jan 22, 1961 ?Gender: Female ?Account #: [id number] ?Procedure:                Colonoscopy ?Indications:              High risk colon cancer surveillance: Personal  ?                          history of colonic polyps; Colonoscopy 214 Dr.  Ardis Hughs 1.1cm TVA. Colonoscopy 10/2016 two subCM  ?                          polyps (1 TA and 1 SSP) ?Medicines:                Monitored Anesthesia Care ?Procedure:                Pre-Anesthesia Assessment: ?                          - Prior to the procedure, a History and Physical  ?                          was performed, and patient medications and  ?                          allergies were reviewed. The patient's tolerance of  ?                          previous anesthesia was also reviewed. The risks  ?                          and benefits of the procedure and the sedation  ?                          options and risks were discussed with the patient.  ?                          All questions were answered, and informed consent  ?                          was obtained. Prior Anticoagulants: The patient has  ?                          taken no previous anticoagulant or antiplatelet  ?                          agents. ASA Grade Assessment: 2 ?                          After obtaining informed consent, the colonoscope  ?  was passed under direct vision. Throughout the  ?                          procedure, the patient's blood pressure, pulse, and  ?                          oxygen saturations were monitored continuously. The  ?                          Olympus CF-[id number] (Serial# [id number]) Colonoscope was  ?                          introduced through the anus and advanced to the the  ?                          cecum, identified by appendiceal orifice and  ?                           ileocecal valve. The colonoscopy was performed  ?                          without difficulty. The patient tolerated the  ?                          procedure well. ?Scope In: 9:26:38 AM ?Scope Out: 1:28:78 AM ?Scope Withdrawal Time: 0 hours 6 minutes 40 seconds  ?Total Procedure Duration: 0 hours 9 minutes 39 seconds  ?Findings:                 Multiple small and large-mouthed diverticula were  ?                          found in the left colon. ?                          The exam was otherwise without abnormality on  ?                          direct and retroflexion views. ?Complications:            No immediate complications. Estimated blood loss:  ?                          None. ?Estimated Blood Loss:     Estimated blood loss: none. ?Impression:               - Diverticulosis in the left colon. ?                          - The examination was otherwise normal on direct  ?                          and retroflexion views. ?                          - No specimens collected. ?Recommendation:           -  Patient has a contact number available for  ?                          emergencies. The signs and symptoms of potential  ?                          delayed complications were discussed with the  ?                          patient. Return to normal activities tomorrow.  ?                          Written discharge instructions were provided to the  ?                          patient. ?                          - Resume previous diet. ?                          - Continue present medications. ?                          - Repeat colonoscopy in 5 years for surveillance. ?                          - Please contact your surgeon about your positional  ?                          left sided abd pains. ?Milus Banister, MD ?01/15/2022 9:45:13 AM ?This report has been signed electronically. ?

## 2022-01-15 NOTE — Progress Notes (Signed)
To Pacu, vss. Report to [email or web address] ?

## 2022-01-17 ENCOUNTER — Telehealth: Payer: Self-pay | Admitting: *Deleted

## 2022-01-17 NOTE — Telephone Encounter (Signed)
?  Follow up Call- ? ?Call back number 01/15/2022  ?Post procedure Call Back phone  # [phone]  ?Permission to leave phone message Yes  ?Some recent data might be hidden  ?  ? ?Patient questions: ? ?Do you have a fever, pain , or abdominal swelling? No. ?Pain Score  0 * ? ?Have you tolerated food without any problems? Yes.   ? ?Have you been able to return to your normal activities? Yes.   ? ?Do you have any questions about your discharge instructions: ?Diet   No. ?Medications  No. ?Follow up visit  No. ? ?Do you have questions or concerns about your Care? No. ? ?Actions: ?* If pain score is 4 or above: ?No action needed, pain <4. ? ? ?

## 2022-02-14 ENCOUNTER — Other Ambulatory Visit (HOSPITAL_COMMUNITY): Payer: Self-pay

## 2022-02-15 ENCOUNTER — Other Ambulatory Visit (HOSPITAL_COMMUNITY): Payer: Self-pay

## 2022-02-15 MED ORDER — MELOXICAM 15 MG PO TABS
15.0000 mg | ORAL_TABLET | Freq: Every day | ORAL | 1 refills | Status: DC
Start: 2022-02-15 — End: 2023-08-30
  Filled 2022-02-15 – 2022-02-27 (×2): qty 90, 90d supply, fill #0

## 2022-02-21 ENCOUNTER — Other Ambulatory Visit (HOSPITAL_COMMUNITY): Payer: Self-pay | Admitting: Family Medicine

## 2022-02-21 DIAGNOSIS — E7800 Pure hypercholesterolemia, unspecified: Secondary | ICD-10-CM

## 2022-02-26 ENCOUNTER — Other Ambulatory Visit (HOSPITAL_COMMUNITY): Payer: Self-pay

## 2022-02-27 ENCOUNTER — Other Ambulatory Visit (HOSPITAL_COMMUNITY): Payer: Self-pay

## 2022-03-05 ENCOUNTER — Ambulatory Visit (HOSPITAL_BASED_OUTPATIENT_CLINIC_OR_DEPARTMENT_OTHER)
Admission: RE | Admit: 2022-03-05 | Discharge: 2022-03-05 | Disposition: A | Discharge status: Home / Self Care | Payer: Self-pay | Source: Ambulatory Visit | Attending: Family Medicine | Admitting: Family Medicine

## 2022-03-05 DIAGNOSIS — E7800 Pure hypercholesterolemia, unspecified: Secondary | ICD-10-CM | POA: Insufficient documentation

## 2022-03-22 DIAGNOSIS — R1032 Left lower quadrant pain: Secondary | ICD-10-CM | POA: Diagnosis not present

## 2022-03-23 ENCOUNTER — Other Ambulatory Visit (HOSPITAL_BASED_OUTPATIENT_CLINIC_OR_DEPARTMENT_OTHER): Payer: Self-pay | Admitting: Family Medicine

## 2022-03-23 ENCOUNTER — Other Ambulatory Visit (HOSPITAL_COMMUNITY): Payer: Self-pay

## 2022-03-23 DIAGNOSIS — R1032 Left lower quadrant pain: Secondary | ICD-10-CM

## 2022-03-23 DIAGNOSIS — J208 Acute bronchitis due to other specified organisms: Secondary | ICD-10-CM | POA: Diagnosis not present

## 2022-03-23 MED ORDER — MELOXICAM 15 MG PO TABS
15.0000 mg | ORAL_TABLET | Freq: Every day | ORAL | 2 refills | Status: DC
  Filled 2022-03-23 – 2022-03-27 (×2): qty 90, 90d supply, fill #0

## 2022-03-23 MED ORDER — ALBUTEROL SULFATE HFA 108 (90 BASE) MCG/ACT IN AERS
2.0000 | INHALATION_SPRAY | RESPIRATORY_TRACT | 0 refills | Status: DC | PRN
  Filled 2022-03-23: qty 18, 17d supply, fill #0

## 2022-03-23 MED ORDER — FOLIVANE-PLUS PO CAPS
1.0000 | ORAL_CAPSULE | Freq: Every day | ORAL | 4 refills | Status: DC
Start: 2022-03-23 — End: 2023-08-30
  Filled 2022-03-23: qty 90, 90d supply, fill #0

## 2022-03-24 ENCOUNTER — Ambulatory Visit (HOSPITAL_BASED_OUTPATIENT_CLINIC_OR_DEPARTMENT_OTHER)
Admission: RE | Admit: 2022-03-24 | Discharge: 2022-03-24 | Disposition: A | Discharge status: Home / Self Care | Payer: 59 | Source: Ambulatory Visit | Attending: Family Medicine | Admitting: Family Medicine

## 2022-03-24 DIAGNOSIS — R1032 Left lower quadrant pain: Secondary | ICD-10-CM | POA: Insufficient documentation

## 2022-03-24 DIAGNOSIS — R109 Unspecified abdominal pain: Secondary | ICD-10-CM | POA: Diagnosis not present

## 2022-03-24 MED ORDER — IOHEXOL 300 MG/ML  SOLN
100.0000 mL | Freq: Once | INTRAMUSCULAR | Status: AC | PRN
  Administered 2022-03-24: 100 mL via INTRAVENOUS

## 2022-03-27 ENCOUNTER — Other Ambulatory Visit (HOSPITAL_COMMUNITY): Payer: Self-pay

## 2022-03-30 ENCOUNTER — Ambulatory Visit (INDEPENDENT_AMBULATORY_CARE_PROVIDER_SITE_OTHER): Payer: Self-pay | Admitting: Physician Assistant

## 2022-03-30 DIAGNOSIS — Z719 Counseling, unspecified: Secondary | ICD-10-CM

## 2022-03-30 NOTE — Progress Notes (Signed)
Preoperative Dx:  Pigmented lesion just medial to right eye. Hair static  Postoperative Dx:  same  Procedure: laser to right nasal area as well as chin   Anesthesia: none  Description of Procedure:  Risks and complications were explained to the patient. Consent was confirmed and signed. Time out was called and all information was confirmed to be correct. The area  area was prepped with alcohol and wiped dry.  BBL Pigment: Wave length: 515, Handpiece: 7 mm, Fluence: 12, Pulse Width: 16, Chill temp: 20, Total # of shots: 2. BBL Forever Bare: Wave length: 640, Handpiece: 15x15, Fluence: 16, Pulse Width: 15, Chill temp: 15, Total # of shots: 1.  The patient tolerated the procedure well and there were no complications. The patient is to follow up in 4 weeks.

## 2022-04-02 ENCOUNTER — Ambulatory Visit (INDEPENDENT_AMBULATORY_CARE_PROVIDER_SITE_OTHER): Payer: Self-pay | Admitting: Plastic Surgery

## 2022-04-02 ENCOUNTER — Encounter: Payer: Self-pay | Admitting: Plastic Surgery

## 2022-04-02 DIAGNOSIS — Z1231 Encounter for screening mammogram for malignant neoplasm of breast: Secondary | ICD-10-CM | POA: Diagnosis not present

## 2022-04-02 DIAGNOSIS — Z719 Counseling, unspecified: Secondary | ICD-10-CM

## 2022-04-02 NOTE — Progress Notes (Signed)
Botulinum Toxin and Filler Injection Procedure Note  Procedure: Cosmetic botulinum toxin and Filler administration  Pre-operative Diagnosis: Dynamic rhytides and volume loss  Post-operative Diagnosis: Same  Complications:  None  Brief history: The patient desires botulinum toxin injection of her forehead. I discussed with the patient this proposed procedure of botulinum toxin injections, which is customized depending on the particular needs of the patient. It is performed on facial rhytids as a temporary correction. The alternatives were discussed with the patient. The risks were addressed including bleeding, scarring, infection, damage to deeper structures, asymmetry, and chronic pain, which may occur infrequently after a procedure. The individual's choice to undergo a surgical procedure is based on the comparison of risks to potential benefits. Other risks include unsatisfactory results, brow ptosis, eyelid ptosis, allergic reaction, temporary paralysis, which should go away with time, bruising, blurring disturbances and delayed healing. Botulinum toxin injections do not arrest the aging process or produce permanent tightening of the eyelid.  Operative intervention maybe necessary to maintain the results of a blepharoplasty or botulinum toxin. The patient understands and wishes to proceed.  Procedure: The area was prepped with alcohol and dried with a clean gauze. Using a clean technique, the botulinum toxin was diluted with 1.25 cc of preservative-free normal saline which was slowly injected with an 18 gauge needle in a tuberculin syringes.  A 32 gauge needles were then used to inject the botulinum toxin. This mixture allow for an aliquot of 4 units per 0.1 cc in each injection site.    Subsequently the mixture was injected in the glabellar and forehead area with preservation of the temporal branch to the lateral eyebrow as well as into each lateral canthal area beginning from the lateral orbital  rim medial to the zygomaticus major in 3 separate areas. A total of 20 Units of botulinum toxin was used. The forehead and glabellar area was injected with care to inject intramuscular only while holding pressure on the supratrochlear vessels in each area during each injection on either side of the medial corrugators. The injection proceeded vertically superiorly to the medial 2/3 of the frontalis muscle and superior 2/3 of the lateral frontalis, again with preservation of the frontal branch.  The nasolabial folds and lateral inferior oral area was injected with one syringe total.  No complications were noted. Light pressure was held for 5 minutes. She was instructed explicitly in post-operative care.  Botox LOT:  [id number] EXP:  2025/5  J. Ultra Plus XC LOT: [id number]

## 2022-04-13 ENCOUNTER — Ambulatory Visit (INDEPENDENT_AMBULATORY_CARE_PROVIDER_SITE_OTHER): Payer: Self-pay | Admitting: Surgical

## 2022-04-13 DIAGNOSIS — Z719 Counseling, unspecified: Secondary | ICD-10-CM

## 2022-04-13 NOTE — Progress Notes (Signed)
Preoperative Dx: pigmented skin lesion, facial hair  Postoperative Dx:  same  Procedure: laser to face   Anesthesia: none  Description of Procedure:  Risks and complications were explained to the patient. Consent was confirmed and signed. Time out was called and all information was confirmed to be correct. The area  area was prepped with alcohol and wiped dry.  BBL Pigment: Wave length: 515, Handpiece: 7 mm, Fluence: 12, Pulse Width: 16, Chill temp: 20, Total # of shots: 2. BBL Forever Bare: Wave length: 640, Handpiece: 15x15, Fluence: 16, Pulse Width: 15, Chill temp: 15, Total # of shots: 4

## 2022-05-18 ENCOUNTER — Ambulatory Visit (INDEPENDENT_AMBULATORY_CARE_PROVIDER_SITE_OTHER): Payer: Self-pay | Admitting: Surgical

## 2022-05-18 DIAGNOSIS — Z719 Counseling, unspecified: Secondary | ICD-10-CM

## 2022-05-18 NOTE — Progress Notes (Signed)
Preoperative Dx: Facial hair and lower extremity vessels  Postoperative Dx:  same  Procedure: laser to face and lower extremity vessels  Anesthesia: none  Description of Procedure:  Risks and complications were explained to the patient. Consent was confirmed and signed. Time out was called and all information was confirmed to be correct. The area  area was prepped with alcohol and wiped dry.   BBL forever bare: Wavelength 640, handpiece 15 x 15, fluence 16, pulse width 15, chill temp 15 C, total number of shots 35.  BBL vessels: Wavelength 640, handpiece 3 mm, fluence 22. Bilateral lower extremity leg vessels were lasered, patient tolerated this well.   The patient tolerated the procedure well and there were no complications.

## 2022-05-31 DIAGNOSIS — Z719 Counseling, unspecified: Secondary | ICD-10-CM

## 2022-06-19 ENCOUNTER — Other Ambulatory Visit (HOSPITAL_COMMUNITY): Payer: Self-pay

## 2022-06-19 MED ORDER — CLINDAMYCIN HCL 150 MG PO CAPS
150.0000 mg | ORAL_CAPSULE | Freq: Three times a day (TID) | ORAL | 1 refills | Status: DC
  Filled 2022-06-19: qty 21, 7d supply, fill #0

## 2022-06-20 DIAGNOSIS — E663 Overweight: Secondary | ICD-10-CM | POA: Diagnosis not present

## 2022-06-29 ENCOUNTER — Ambulatory Visit (INDEPENDENT_AMBULATORY_CARE_PROVIDER_SITE_OTHER): Payer: Self-pay | Admitting: Surgical

## 2022-06-29 DIAGNOSIS — Z719 Counseling, unspecified: Secondary | ICD-10-CM

## 2022-06-29 NOTE — Progress Notes (Signed)
Preoperative Dx: Facial telangectasia, facial hair, sun spot of medial right nasal bridge  Postoperative Dx:  same  Procedure: laser to face   Anesthesia: none  Description of Procedure:  Risks and complications were explained to the patient. Consent was confirmed and signed. Time out was called and all information was confirmed to be correct. The area  area was prepped with alcohol and wiped dry.   The bilateral cheeks were lasered using the 560 nm BBL laser with a setting of 21 J/cm, pulse width 30 ms and a temperature of 20 C for total of 14 shots.    The chin and cheeks were then lasered using the BBL hair static with a 590 nm laser.  18 J/cm, pulse width 15 ms, temperature of 13 C.  Patient tolerated this well for a total of 13 shots.  The 515 nm laser was used at 12 J/cm to laser a sunspot of the medial right nasal bridge.   Patient tolerated the entire procedure well, care was taken not to overlap passes to decrease risk of burns.  There were no complications noted.

## 2022-07-03 ENCOUNTER — Other Ambulatory Visit (HOSPITAL_COMMUNITY): Payer: Self-pay

## 2022-07-03 MED ORDER — AZITHROMYCIN 250 MG PO TABS
ORAL_TABLET | ORAL | 0 refills | Status: AC
  Filled 2022-07-03: qty 6, 5d supply, fill #0

## 2022-07-06 ENCOUNTER — Other Ambulatory Visit (HOSPITAL_COMMUNITY): Payer: Self-pay

## 2022-07-06 MED ORDER — DEXAMETHASONE 4 MG PO TABS
ORAL_TABLET | ORAL | 0 refills | Status: AC
  Filled 2022-07-06: qty 18, 9d supply, fill #0

## 2022-07-09 ENCOUNTER — Encounter: Payer: Self-pay | Admitting: Plastic Surgery

## 2022-07-09 ENCOUNTER — Ambulatory Visit (INDEPENDENT_AMBULATORY_CARE_PROVIDER_SITE_OTHER): Payer: Self-pay | Admitting: Plastic Surgery

## 2022-07-09 DIAGNOSIS — Z719 Counseling, unspecified: Secondary | ICD-10-CM

## 2022-07-09 NOTE — Progress Notes (Signed)
Botulinum Toxin Procedure Note  Procedure: Cosmetic botulinum toxin   Pre-operative Diagnosis: Dynamic rhytides   Post-operative Diagnosis: Same  Complications:  None  Brief history: The patient desires botulinum toxin injection of her forehead. I discussed with the patient this proposed procedure of botulinum toxin injections, which is customized depending on the particular needs of the patient. It is performed on facial rhytids as a temporary correction. The alternatives were discussed with the patient. The risks were addressed including bleeding, scarring, infection, damage to deeper structures, asymmetry, and chronic pain, which may occur infrequently after a procedure. The individual's choice to undergo a surgical procedure is based on the comparison of risks to potential benefits. Other risks include unsatisfactory results, brow ptosis, eyelid ptosis, allergic reaction, temporary paralysis, which should go away with time, bruising, blurring disturbances and delayed healing. Botulinum toxin injections do not arrest the aging process or produce permanent tightening of the eyelid.  Operative intervention maybe necessary to maintain the results of a blepharoplasty or botulinum toxin. The patient understands and wishes to proceed.  Procedure: The area was prepped with alcohol and dried with a clean gauze. Using a clean technique, the botulinum toxin was diluted with 1.25 cc of preservative-free normal saline which was slowly injected with an 18 gauge needle in a tuberculin syringes.  A 32 gauge needles were then used to inject the botulinum toxin. This mixture allow for an aliquot of 4 units per 0.1 cc in each injection site.    Subsequently the mixture was injected in the glabellar area. A total of 4 Units of botulinum toxin was used.  No complications were noted. Light pressure was held for 5 minutes. She was instructed explicitly in post-operative care.  Botox LOT:  [id number]  EXP:  2025/9

## 2022-07-16 ENCOUNTER — Other Ambulatory Visit (HOSPITAL_COMMUNITY): Payer: Self-pay

## 2022-07-16 DIAGNOSIS — R635 Abnormal weight gain: Secondary | ICD-10-CM | POA: Diagnosis not present

## 2022-07-16 DIAGNOSIS — Z131 Encounter for screening for diabetes mellitus: Secondary | ICD-10-CM | POA: Diagnosis not present

## 2022-07-16 DIAGNOSIS — E8889 Other specified metabolic disorders: Secondary | ICD-10-CM | POA: Diagnosis not present

## 2022-07-16 DIAGNOSIS — F988 Other specified behavioral and emotional disorders with onset usually occurring in childhood and adolescence: Secondary | ICD-10-CM | POA: Diagnosis not present

## 2022-07-16 DIAGNOSIS — Z1331 Encounter for screening for depression: Secondary | ICD-10-CM | POA: Diagnosis not present

## 2022-07-16 DIAGNOSIS — E559 Vitamin D deficiency, unspecified: Secondary | ICD-10-CM | POA: Diagnosis not present

## 2022-07-16 DIAGNOSIS — E7800 Pure hypercholesterolemia, unspecified: Secondary | ICD-10-CM | POA: Diagnosis not present

## 2022-07-16 DIAGNOSIS — E663 Overweight: Secondary | ICD-10-CM | POA: Diagnosis not present

## 2022-07-16 MED ORDER — AZITHROMYCIN 250 MG PO TABS
ORAL_TABLET | ORAL | 0 refills | Status: AC
  Filled 2022-07-16: qty 6, 5d supply, fill #0

## 2022-07-17 ENCOUNTER — Other Ambulatory Visit (HOSPITAL_COMMUNITY): Payer: Self-pay

## 2022-07-17 MED ORDER — WEGOVY 0.25 MG/0.5ML ~~LOC~~ SOAJ
0.2500 mg | SUBCUTANEOUS | 0 refills | Status: DC
  Filled 2022-07-17: qty 2, 28d supply, fill #0

## 2022-07-17 MED ORDER — ONDANSETRON HCL 4 MG PO TABS
4.0000 mg | ORAL_TABLET | Freq: Four times a day (QID) | ORAL | 0 refills | Status: DC | PRN
  Filled 2022-07-17: qty 20, 5d supply, fill #0

## 2022-07-19 ENCOUNTER — Other Ambulatory Visit (HOSPITAL_COMMUNITY): Payer: Self-pay

## 2022-07-19 ENCOUNTER — Other Ambulatory Visit (HOSPITAL_BASED_OUTPATIENT_CLINIC_OR_DEPARTMENT_OTHER): Payer: Self-pay

## 2022-07-19 MED ORDER — WEGOVY 0.25 MG/0.5ML ~~LOC~~ SOAJ
SUBCUTANEOUS | 0 refills | Status: DC
Start: 2022-07-19 — End: 2023-08-30
  Filled 2022-07-19: qty 2, 28d supply, fill #0

## 2022-07-19 MED ORDER — VITAMIN D (ERGOCALCIFEROL) 1.25 MG (50000 UNIT) PO CAPS
50000.0000 [IU] | ORAL_CAPSULE | ORAL | 2 refills | Status: DC
  Filled 2022-07-19: qty 12, 84d supply, fill #0
  Filled 2022-10-24: qty 12, 84d supply, fill #1
  Filled 2023-02-24: qty 12, 84d supply, fill #2

## 2022-07-26 ENCOUNTER — Encounter: Payer: Self-pay | Admitting: Podiatry

## 2022-07-31 ENCOUNTER — Other Ambulatory Visit (HOSPITAL_COMMUNITY): Payer: Self-pay

## 2022-07-31 MED ORDER — LIDOCAINE VISCOUS HCL 2 % MT SOLN
OROMUCOSAL | 0 refills | Status: DC
  Filled 2022-07-31: qty 250, 3d supply, fill #0

## 2022-08-01 ENCOUNTER — Ambulatory Visit (INDEPENDENT_AMBULATORY_CARE_PROVIDER_SITE_OTHER): Payer: Self-pay | Admitting: Physician Assistant

## 2022-08-01 DIAGNOSIS — Z719 Counseling, unspecified: Secondary | ICD-10-CM

## 2022-08-01 NOTE — Progress Notes (Signed)
Preoperative Dx: Vascular lesions on face, just lateral to nose bilaterally.  Postoperative Dx:  same  Procedure: laser to face   Anesthesia: none  Description of Procedure:  Risks and complications were explained to the patient. Consent was confirmed and signed. Time out was called and all information was confirmed to be correct. The area  area was prepped with alcohol and wiped dry. BBL laser was set at 21 J/cm2 using 560 nm filter with 30 width and 20 degrees Celsius. The face was lasered. The patient tolerated the procedure well and there were no complications. The patient is to follow up in 4 weeks.

## 2022-08-02 ENCOUNTER — Ambulatory Visit (INDEPENDENT_AMBULATORY_CARE_PROVIDER_SITE_OTHER): Payer: Self-pay | Admitting: Physician Assistant

## 2022-08-02 DIAGNOSIS — Z719 Counseling, unspecified: Secondary | ICD-10-CM

## 2022-08-02 NOTE — Progress Notes (Signed)
Preoperative Dx: Laura Wall angiomas on upper chest (#6), left forearm (#2), and 1 superiolateral to left eyebrow.  Postoperative Dx:  same  Procedure: laser to face   Anesthesia: none  Description of Procedure:  Risks and complications were explained to the patient. Consent was confirmed and signed. Time out was called and all information was confirmed to be correct. The area  area was prepped with alcohol and wiped dry. BBL laser (vascular lesions > Cherry angioma setting) was set at 21 J/cm2 using 560 nm filter with 14 width and 20 degrees Celsius. The cherry angiomas (#9) were lasered. The patient tolerated the procedure well and there were no complications. The patient is to follow up in 4 weeks.

## 2022-08-09 ENCOUNTER — Other Ambulatory Visit (HOSPITAL_BASED_OUTPATIENT_CLINIC_OR_DEPARTMENT_OTHER): Payer: Self-pay

## 2022-08-09 ENCOUNTER — Other Ambulatory Visit (HOSPITAL_COMMUNITY): Payer: Self-pay

## 2022-08-09 MED ORDER — WEGOVY 1 MG/0.5ML ~~LOC~~ SOAJ
1.0000 mg | SUBCUTANEOUS | 0 refills | Status: DC
  Filled 2022-08-09: qty 2, 28d supply, fill #0

## 2022-08-09 MED ORDER — WEGOVY 0.5 MG/0.5ML ~~LOC~~ SOAJ
0.5000 mg | SUBCUTANEOUS | 0 refills | Status: DC
  Filled 2022-08-09: qty 2, 28d supply, fill #0

## 2022-08-10 DIAGNOSIS — Z23 Encounter for immunization: Secondary | ICD-10-CM | POA: Diagnosis not present

## 2022-08-10 DIAGNOSIS — Z0000 Encounter for general adult medical examination without abnormal findings: Secondary | ICD-10-CM | POA: Diagnosis not present

## 2022-08-20 ENCOUNTER — Other Ambulatory Visit (HOSPITAL_COMMUNITY): Payer: Self-pay

## 2022-08-20 ENCOUNTER — Ambulatory Visit (INDEPENDENT_AMBULATORY_CARE_PROVIDER_SITE_OTHER): Payer: Self-pay | Admitting: Surgical

## 2022-08-20 DIAGNOSIS — D649 Anemia, unspecified: Secondary | ICD-10-CM | POA: Diagnosis not present

## 2022-08-20 DIAGNOSIS — Z6825 Body mass index (BMI) 25.0-25.9, adult: Secondary | ICD-10-CM | POA: Diagnosis not present

## 2022-08-20 DIAGNOSIS — Z01419 Encounter for gynecological examination (general) (routine) without abnormal findings: Secondary | ICD-10-CM | POA: Diagnosis not present

## 2022-08-20 DIAGNOSIS — Z719 Counseling, unspecified: Secondary | ICD-10-CM

## 2022-08-20 DIAGNOSIS — Z1231 Encounter for screening mammogram for malignant neoplasm of breast: Secondary | ICD-10-CM | POA: Diagnosis not present

## 2022-08-20 DIAGNOSIS — N898 Other specified noninflammatory disorders of vagina: Secondary | ICD-10-CM | POA: Diagnosis not present

## 2022-08-20 DIAGNOSIS — Z1211 Encounter for screening for malignant neoplasm of colon: Secondary | ICD-10-CM | POA: Diagnosis not present

## 2022-08-20 MED ORDER — PREMARIN 0.625 MG/GM VA CREA
TOPICAL_CREAM | VAGINAL | 11 refills | Status: DC
  Filled 2022-08-20: qty 30, 30d supply, fill #0
  Filled 2023-02-24: qty 30, 30d supply, fill #1

## 2022-08-20 MED ORDER — FOLIVANE-PLUS PO CAPS
ORAL_CAPSULE | ORAL | 3 refills | Status: DC
  Filled 2022-08-20: qty 90, 90d supply, fill #0
  Filled 2023-02-24: qty 90, 90d supply, fill #1
  Filled 2023-08-13: qty 90, 90d supply, fill #2

## 2022-08-20 NOTE — Progress Notes (Signed)
Preoperative Dx: Facial telangiectasias, cherry angiomas, facial hair  Postoperative Dx:  same  Procedure: laser to face and neck  Anesthesia: none  Description of Procedure:  Risks and complications were explained to the patient. Consent was confirmed and signed. Time out was called and all information was confirmed to be correct. The area  area was prepped with alcohol and wiped dry.  The BBL laser was set at 21 J/cm using the 560 nm filter with 14 pulse width and 20 C.  The cherry angiomas were lasered.  There were no complications.  The cherry angiomas changed from a red to dark blue hue.  The BBL laser was then set at 560 nm, 21 J/cm, pulse width 30, temperature 20 C for total of 14 shots targeting the telangiectasias.  The expected clinical endpoint was achieved.  There were no complications.  The BBL laser was then set at BBL hair static with a 590 nm laser, 18 J/cm, pulse width 15, temperature of 13 C.  The chin was lasered, the jawline was lasered.  Patient tolerated this well.  Clinical endpoint was achieved.  There were no complications.  Patient tolerated the entire procedure, follow-up as needed.

## 2022-08-21 ENCOUNTER — Other Ambulatory Visit (HOSPITAL_COMMUNITY): Payer: Self-pay

## 2022-08-23 DIAGNOSIS — R7303 Prediabetes: Secondary | ICD-10-CM | POA: Diagnosis not present

## 2022-08-23 DIAGNOSIS — E663 Overweight: Secondary | ICD-10-CM | POA: Diagnosis not present

## 2022-08-23 DIAGNOSIS — E559 Vitamin D deficiency, unspecified: Secondary | ICD-10-CM | POA: Diagnosis not present

## 2022-08-23 DIAGNOSIS — E7800 Pure hypercholesterolemia, unspecified: Secondary | ICD-10-CM | POA: Diagnosis not present

## 2022-08-25 ENCOUNTER — Other Ambulatory Visit (HOSPITAL_COMMUNITY): Payer: Self-pay

## 2022-08-25 MED ORDER — WEGOVY 1 MG/0.5ML ~~LOC~~ SOAJ
1.0000 mg | SUBCUTANEOUS | 0 refills | Status: DC
  Filled 2022-08-25: qty 2, 28d supply, fill #0

## 2022-08-27 ENCOUNTER — Other Ambulatory Visit (HOSPITAL_COMMUNITY): Payer: Self-pay

## 2022-08-30 ENCOUNTER — Other Ambulatory Visit (HOSPITAL_COMMUNITY): Payer: Self-pay

## 2022-09-05 ENCOUNTER — Other Ambulatory Visit (HOSPITAL_COMMUNITY): Payer: Self-pay

## 2022-09-27 ENCOUNTER — Other Ambulatory Visit (HOSPITAL_COMMUNITY): Payer: Self-pay

## 2022-09-27 MED ORDER — WEGOVY 1.7 MG/0.75ML ~~LOC~~ SOAJ
1.7000 mg | SUBCUTANEOUS | 1 refills | Status: DC
  Filled 2022-09-27: qty 3, 28d supply, fill #0
  Filled 2022-10-24 – 2023-08-13 (×3): qty 3, 28d supply, fill #1

## 2022-09-28 ENCOUNTER — Other Ambulatory Visit (HOSPITAL_COMMUNITY): Payer: Self-pay

## 2022-10-02 ENCOUNTER — Other Ambulatory Visit (HOSPITAL_COMMUNITY): Payer: Self-pay

## 2022-10-02 MED ORDER — CLINDAMYCIN HCL 150 MG PO CAPS
ORAL_CAPSULE | ORAL | 1 refills | Status: DC
  Filled 2022-10-02: qty 21, 6d supply, fill #0
  Filled 2023-02-24: qty 21, 6d supply, fill #1

## 2022-10-02 MED ORDER — METHYLPREDNISOLONE 4 MG PO TBPK
ORAL_TABLET | ORAL | 1 refills | Status: DC
  Filled 2022-10-02: qty 21, 6d supply, fill #0
  Filled 2023-02-24: qty 21, 6d supply, fill #1

## 2022-10-16 ENCOUNTER — Ambulatory Visit (INDEPENDENT_AMBULATORY_CARE_PROVIDER_SITE_OTHER): Payer: Self-pay | Admitting: Physician Assistant

## 2022-10-16 DIAGNOSIS — Z719 Counseling, unspecified: Secondary | ICD-10-CM

## 2022-10-16 NOTE — Progress Notes (Signed)
Preoperative Dx: solar lentigo   Postoperative Dx:  same  Procedure: laser to face, legs  Anesthesia: none  Description of Procedure:  Risks and complications were explained to the patient. Consent was confirmed and signed. Time out was called and all information was confirmed to be correct. The area  area was prepped with alcohol and wiped dry. The Nordyls FRAX 1550 laser was set at 3.3 J/cm2. The face was lasered. The patient tolerated the procedure well and there were no complications. The patient is to follow up in 4 weeks.   Preoperative Dx: telangiectasis  Postoperative Dx:  same  Procedure: laser to legs, face  Anesthesia: none  Description of Procedure:  Risks and complications were explained to the patient. Consent was confirmed and signed. Time out was called and all information was confirmed to be correct. The area  area was prepped with alcohol and wiped dry. The Nordyls laser was set at 13.0 J/cm2. legs were lasered the settings were changed to 285.1 j/cm2 and the legs were lasered. The patient tolerated the procedure well and there were no complications. The patient is to follow up in 4 weeks.  Preoperative Dx: facial hair   Postoperative Dx:  same  Procedure: laser to chin   Anesthesia: none  Description of Procedure:  Risks and complications were explained to the patient. Consent was confirmed and signed. Time out was called and all information was confirmed to be correct. The area  area was prepped with alcohol and wiped dry. The Nordyls HR600 laser was set at 16.0 J/cm2. The chin was lasered. The patient tolerated the procedure well and there were no complications. The patient is to follow up in 4 weeks.  Preoperative Dx: facial rejuvenation   Postoperative Dx:  same  Procedure: laser to face   Anesthesia: none  Description of Procedure:  Risks and complications were explained to the patient. Consent was confirmed and signed. Time out was called and all  information was confirmed to be correct. The area  area was prepped with alcohol and wiped dry. The Nordyls FRAX 1550 laser was set at 34 J/cm2. The face was lasered. The patient tolerated the procedure well and there were no complications. The patient is to follow up in 4 weeks.

## 2022-10-23 DIAGNOSIS — E559 Vitamin D deficiency, unspecified: Secondary | ICD-10-CM | POA: Diagnosis not present

## 2022-10-23 DIAGNOSIS — R7303 Prediabetes: Secondary | ICD-10-CM | POA: Diagnosis not present

## 2022-10-23 DIAGNOSIS — E663 Overweight: Secondary | ICD-10-CM | POA: Diagnosis not present

## 2022-10-23 DIAGNOSIS — E7800 Pure hypercholesterolemia, unspecified: Secondary | ICD-10-CM | POA: Diagnosis not present

## 2022-10-24 ENCOUNTER — Other Ambulatory Visit: Payer: Self-pay

## 2022-10-26 ENCOUNTER — Other Ambulatory Visit (HOSPITAL_COMMUNITY): Payer: Self-pay

## 2022-10-31 ENCOUNTER — Other Ambulatory Visit (HOSPITAL_COMMUNITY): Payer: Self-pay

## 2022-11-02 ENCOUNTER — Other Ambulatory Visit (HOSPITAL_COMMUNITY): Payer: Self-pay

## 2022-11-02 ENCOUNTER — Other Ambulatory Visit: Payer: Self-pay

## 2022-11-05 ENCOUNTER — Other Ambulatory Visit (HOSPITAL_COMMUNITY): Payer: Self-pay

## 2022-11-12 ENCOUNTER — Telehealth: Payer: Self-pay | Admitting: Physician Assistant

## 2022-11-12 ENCOUNTER — Telehealth: Payer: Self-pay | Admitting: Plastic Surgery

## 2022-11-12 ENCOUNTER — Other Ambulatory Visit (HOSPITAL_COMMUNITY): Payer: Self-pay

## 2022-11-12 MED ORDER — VALACYCLOVIR HCL 1 G PO TABS
1000.0000 mg | ORAL_TABLET | Freq: Two times a day (BID) | ORAL | 0 refills | Status: AC
  Filled 2022-11-12: qty 20, 10d supply, fill #0

## 2022-11-12 MED ORDER — LIDOCAINE 23% - TETRACAINE 7% TOPICAL OINTMENT (PLASTICIZED)
1.0000 | TOPICAL_OINTMENT | Freq: Once | CUTANEOUS | 0 refills | Status: AC
  Filled 2022-11-12: qty 60, 30d supply, fill #0

## 2022-11-12 NOTE — Telephone Encounter (Signed)
Script sent for antiviral for cold sore prior to laser therapy

## 2022-11-12 NOTE — Telephone Encounter (Signed)
Lidocaine sent for Halo

## 2022-11-14 DIAGNOSIS — L578 Other skin changes due to chronic exposure to nonionizing radiation: Secondary | ICD-10-CM | POA: Diagnosis not present

## 2022-11-14 DIAGNOSIS — L821 Other seborrheic keratosis: Secondary | ICD-10-CM | POA: Diagnosis not present

## 2022-11-14 DIAGNOSIS — D2272 Melanocytic nevi of left lower limb, including hip: Secondary | ICD-10-CM | POA: Diagnosis not present

## 2022-11-14 DIAGNOSIS — Z86018 Personal history of other benign neoplasm: Secondary | ICD-10-CM | POA: Diagnosis not present

## 2022-11-14 DIAGNOSIS — D485 Neoplasm of uncertain behavior of skin: Secondary | ICD-10-CM | POA: Diagnosis not present

## 2022-11-14 DIAGNOSIS — D224 Melanocytic nevi of scalp and neck: Secondary | ICD-10-CM | POA: Diagnosis not present

## 2022-11-14 DIAGNOSIS — D225 Melanocytic nevi of trunk: Secondary | ICD-10-CM | POA: Diagnosis not present

## 2022-11-14 DIAGNOSIS — B079 Viral wart, unspecified: Secondary | ICD-10-CM | POA: Diagnosis not present

## 2022-11-14 DIAGNOSIS — L818 Other specified disorders of pigmentation: Secondary | ICD-10-CM | POA: Diagnosis not present

## 2022-11-14 DIAGNOSIS — Z85828 Personal history of other malignant neoplasm of skin: Secondary | ICD-10-CM | POA: Diagnosis not present

## 2022-11-14 DIAGNOSIS — D2230 Melanocytic nevi of unspecified part of face: Secondary | ICD-10-CM | POA: Diagnosis not present

## 2022-11-16 ENCOUNTER — Ambulatory Visit (INDEPENDENT_AMBULATORY_CARE_PROVIDER_SITE_OTHER): Payer: Self-pay | Admitting: Plastic Surgery

## 2022-11-16 DIAGNOSIS — Z719 Counseling, unspecified: Secondary | ICD-10-CM

## 2022-11-16 NOTE — Progress Notes (Signed)
HALO Treatment   Treatment  Settings:  in media   Topical and/or Block: 23 % lidocaine and Tetracaine  Post Care: Actions given  Second treatment

## 2022-11-19 DIAGNOSIS — Z719 Counseling, unspecified: Secondary | ICD-10-CM

## 2022-11-30 ENCOUNTER — Ambulatory Visit (INDEPENDENT_AMBULATORY_CARE_PROVIDER_SITE_OTHER): Payer: Self-pay | Admitting: Physician Assistant

## 2022-11-30 DIAGNOSIS — L988 Other specified disorders of the skin and subcutaneous tissue: Secondary | ICD-10-CM

## 2022-11-30 DIAGNOSIS — H524 Presbyopia: Secondary | ICD-10-CM | POA: Diagnosis not present

## 2022-11-30 NOTE — Progress Notes (Signed)
Botulinum Toxin Procedure Note   Procedure: Cosmetic botulinum toxin   Pre-operative Diagnosis: Dynamic rhytides   Post-operative Diagnosis: Same   Complications:  None   Brief history: The patient desires botulinum toxin injection.  They are aware of the risks including bleeding, damage to deeper structures, asymmetry, brow ptosis, eyelid ptosis, bruising. The patient understands and wishes to proceed.  Patient reports that she has had Botox in the past, but never Dysport.  She has had mild bruising associated with Botox previously, but nothing significant.  She would like to have Dysport today for rhytids.    Procedure: The area was prepped with alcohol and dried with a clean gauze.  Using a clean technique the botulinum toxin was diluted with 3 mL of bacteriostatic saline per 100 unit vial which resulted in 10 units per 0.1 mL.   Subsequently the mixture was injected in the glabellar, forehead area with preservation of the temporal branch to the lateral eyebrow. A total of 100 Units of botulinum toxin was used. The forehead, glabella and crows feet were prepped and injected in a standard injection pattern using 2 injection points for the crows feet, 5-point V-shape glabellar/procerus injection, and single row for the forehead.  They tolerated this fine.      No complications were noted. Light pressure was held for 5 minutes. They were instructed explicitly in post-operative care.   Dysport LOT:  [id number] EXP:  07/28/2024

## 2022-12-24 DIAGNOSIS — Z719 Counseling, unspecified: Secondary | ICD-10-CM

## 2022-12-26 ENCOUNTER — Other Ambulatory Visit (HOSPITAL_COMMUNITY): Payer: Self-pay

## 2022-12-26 DIAGNOSIS — E7800 Pure hypercholesterolemia, unspecified: Secondary | ICD-10-CM | POA: Diagnosis not present

## 2022-12-26 DIAGNOSIS — R7303 Prediabetes: Secondary | ICD-10-CM | POA: Diagnosis not present

## 2022-12-26 DIAGNOSIS — Z6823 Body mass index (BMI) 23.0-23.9, adult: Secondary | ICD-10-CM | POA: Diagnosis not present

## 2022-12-26 DIAGNOSIS — E559 Vitamin D deficiency, unspecified: Secondary | ICD-10-CM | POA: Diagnosis not present

## 2022-12-26 MED ORDER — WEGOVY 1.7 MG/0.75ML ~~LOC~~ SOAJ
1.7000 mg | SUBCUTANEOUS | 1 refills | Status: AC
  Filled 2022-12-26: qty 3, 28d supply, fill #0
  Filled 2023-01-18: qty 3, 28d supply, fill #1

## 2022-12-27 ENCOUNTER — Other Ambulatory Visit (HOSPITAL_COMMUNITY): Payer: Self-pay

## 2022-12-27 MED ORDER — DEXAMETHASONE 4 MG PO TABS
ORAL_TABLET | ORAL | 0 refills | Status: AC
  Filled 2022-12-27: qty 18, 9d supply, fill #0

## 2022-12-27 MED ORDER — AZITHROMYCIN 250 MG PO TABS
ORAL_TABLET | ORAL | 0 refills | Status: DC
  Filled 2022-12-27: qty 6, 5d supply, fill #0

## 2022-12-28 ENCOUNTER — Other Ambulatory Visit (HOSPITAL_COMMUNITY): Payer: Self-pay

## 2023-01-07 ENCOUNTER — Ambulatory Visit (INDEPENDENT_AMBULATORY_CARE_PROVIDER_SITE_OTHER): Payer: Self-pay | Admitting: Physician Assistant

## 2023-01-07 DIAGNOSIS — L814 Other melanin hyperpigmentation: Secondary | ICD-10-CM

## 2023-01-07 NOTE — Progress Notes (Signed)
Preoperative Dx: solar lentigo   Postoperative Dx:  same  Procedure: laser to arms   Anesthesia: none  Description of Procedure:  Risks and complications were explained to the patient. Consent was confirmed and signed. Time out was called and all information was confirmed to be correct. The area  area was prepped with alcohol and wiped dry. The Nordyls VL laser was set at 11 J/cm2. The arms was lasered. The patient tolerated the procedure well and there were no complications. The patient is to follow up in 4 weeks.  Preoperative Dx: hirsutism   Postoperative Dx:  same  Procedure: laser to chin   Anesthesia: none  Description of Procedure:  Risks and complications were explained to the patient. Consent was confirmed and signed. Time out was called and all information was confirmed to be correct. The area  area was prepped with alcohol and wiped dry. The Nordyls VR laser was set at 16 J/cm2. The chin was lasered. The patient tolerated the procedure well and there were no complications. The patient is to follow up in 4 weeks.

## 2023-01-15 ENCOUNTER — Other Ambulatory Visit (HOSPITAL_COMMUNITY): Payer: Self-pay

## 2023-01-15 ENCOUNTER — Other Ambulatory Visit: Payer: Self-pay | Admitting: Physician Assistant

## 2023-01-15 MED ORDER — VALACYCLOVIR HCL 1 G PO TABS
1000.0000 mg | ORAL_TABLET | Freq: Two times a day (BID) | ORAL | 2 refills | Status: DC
  Filled 2023-01-15: qty 20, 10d supply, fill #0
  Filled 2023-02-24: qty 20, 10d supply, fill #1
  Filled 2023-08-13: qty 20, 10d supply, fill #2

## 2023-01-21 ENCOUNTER — Other Ambulatory Visit (HOSPITAL_COMMUNITY): Payer: Self-pay

## 2023-01-21 MED ORDER — WEGOVY 1.7 MG/0.75ML ~~LOC~~ SOAJ: 1.7000 mg | SUBCUTANEOUS | 1 refills | Status: DC

## 2023-01-23 DIAGNOSIS — Z719 Counseling, unspecified: Secondary | ICD-10-CM

## 2023-01-25 ENCOUNTER — Ambulatory Visit (INDEPENDENT_AMBULATORY_CARE_PROVIDER_SITE_OTHER): Payer: Self-pay | Admitting: Plastic Surgery

## 2023-01-25 DIAGNOSIS — Z719 Counseling, unspecified: Secondary | ICD-10-CM

## 2023-01-25 NOTE — Progress Notes (Signed)
Filler Injection Procedure Note  Procedure:  Filler administration  Pre-operative Diagnosis: Rytides   Post-operative Diagnosis: Same  Surgeon: Electronically signed by: Theodoro Kos, DO   Complications:  None  Brief history: The patient desires injection with fillers in her face. I discussed with the patient this proposed procedure of filler injections, which is customized depending on the particular needs of the patient. It is performed on facial volume loss as a temporary correction. The alternatives were discussed with the patient. The risks were addressed including bleeding, scarring, infection, damage to deeper structures, asymmetry, and chronic pain, which may occur infrequently after a procedure. The individual's choice to undergo a surgical procedure is based on the comparison of risks to potential benefits. Other risks include unsatisfactory results, allergic reaction, which should go away with time, bruising and delayed healing. Fillers do not arrest the aging process or produce permanent tightening.  Operative intervention maybe necessary to maintain the results. The patient understands and wishes to proceed. An informed consent was signed and informational brochures given to her prior to the procedure.  Procedure: The area was prepped with chlorhexidine and dried with a clean gauze. Using a clean technique, a 30 gauge needle was then used to inject the filler into the left nasal labial fold and lateral perioral folds on both sides. This was done with one syringe.   The midface area was injected at the medial sub-region of the mid-face.  A total of one syringe (Voluma) was used. No complications were noted. Light pressure was held for 5 minutes. She was instructed explicitly in post-operative care.  Juvederm Vollure XC LOT: [id number]  Campbell Lerner LOT: [id number]

## 2023-01-29 DIAGNOSIS — Z719 Counseling, unspecified: Secondary | ICD-10-CM

## 2023-01-31 ENCOUNTER — Other Ambulatory Visit (HOSPITAL_COMMUNITY): Payer: Self-pay

## 2023-01-31 DIAGNOSIS — Z6823 Body mass index (BMI) 23.0-23.9, adult: Secondary | ICD-10-CM | POA: Diagnosis not present

## 2023-01-31 DIAGNOSIS — E7800 Pure hypercholesterolemia, unspecified: Secondary | ICD-10-CM | POA: Diagnosis not present

## 2023-01-31 DIAGNOSIS — R7303 Prediabetes: Secondary | ICD-10-CM | POA: Diagnosis not present

## 2023-01-31 DIAGNOSIS — E559 Vitamin D deficiency, unspecified: Secondary | ICD-10-CM | POA: Diagnosis not present

## 2023-01-31 MED ORDER — WEGOVY 1.7 MG/0.75ML ~~LOC~~ SOAJ
1.7000 mg | SUBCUTANEOUS | 1 refills | Status: DC
  Filled 2023-01-31 – 2023-02-25 (×2): qty 3, 28d supply, fill #0

## 2023-02-25 ENCOUNTER — Other Ambulatory Visit: Payer: Self-pay

## 2023-02-25 ENCOUNTER — Other Ambulatory Visit (HOSPITAL_COMMUNITY): Payer: Self-pay

## 2023-02-25 DIAGNOSIS — Z719 Counseling, unspecified: Secondary | ICD-10-CM

## 2023-02-26 ENCOUNTER — Other Ambulatory Visit (HOSPITAL_COMMUNITY): Payer: Self-pay

## 2023-03-16 ENCOUNTER — Ambulatory Visit (INDEPENDENT_AMBULATORY_CARE_PROVIDER_SITE_OTHER): Payer: Self-pay | Admitting: Plastic Surgery

## 2023-03-16 DIAGNOSIS — Z719 Counseling, unspecified: Secondary | ICD-10-CM

## 2023-03-16 NOTE — Progress Notes (Signed)
Botulinum Toxin Procedure Note  Procedure: Cosmetic botulinum toxin   Pre-operative Diagnosis: Dynamic rhytides   Post-operative Diagnosis: Same  Complications:  None  Brief history: The patient desires botulinum toxin injection of her forehead. I discussed with the patient this proposed procedure of botulinum toxin injections, which is customized depending on the particular needs of the patient. It is performed on facial rhytids as a temporary correction. The alternatives were discussed with the patient. The risks were addressed including bleeding, scarring, infection, damage to deeper structures, asymmetry, and chronic pain, which may occur infrequently after a procedure. The individual's choice to undergo a surgical procedure is based on the comparison of risks to potential benefits. Other risks include unsatisfactory results, brow ptosis, eyelid ptosis, allergic reaction, temporary paralysis, which should go away with time, bruising, blurring disturbances and delayed healing. Botulinum toxin injections do not arrest the aging process or produce permanent tightening of the eyelid.  Operative intervention maybe necessary to maintain the results of a blepharoplasty or botulinum toxin. The patient understands and wishes to proceed.  Procedure: The area was prepped with alcohol and dried with a clean gauze. Using a clean technique, the botulinum toxin was diluted with 1.25 cc of preservative-free normal saline which was slowly injected with an 18 gauge needle in a tuberculin syringes.  A 32 gauge needles were then used to inject the botulinum toxin. This mixture allow for an aliquot of 4 units per 0.1 cc in each injection site.    Subsequently the mixture was injected in the glabellar area.  Then to the lateral eyebrow as well as into each lateral canthal area beginning from the lateral orbital rim in 3 separate areas. A total of 12 Units of botulinum toxin was used. No complications were noted. Light  pressure was held for 5 minutes. She was instructed explicitly in post-operative care.  Botox LOT:  [id number]

## 2023-03-20 ENCOUNTER — Other Ambulatory Visit (HOSPITAL_COMMUNITY): Payer: Self-pay

## 2023-03-20 ENCOUNTER — Telehealth: Payer: Self-pay | Admitting: Physician Assistant

## 2023-03-20 MED ORDER — OMEPRAZOLE 20 MG PO CPDR
20.0000 mg | DELAYED_RELEASE_CAPSULE | Freq: Every day | ORAL | 0 refills | Status: DC
Start: 2023-03-20 — End: 2023-08-13
  Filled 2023-03-20 (×2): qty 30, 30d supply, fill #0

## 2023-03-20 MED ORDER — METHYLPREDNISOLONE 4 MG PO TBPK
ORAL_TABLET | ORAL | 1 refills | Status: DC
  Filled 2023-03-20 (×2): qty 21, 6d supply, fill #0

## 2023-03-20 MED ORDER — SULFAMETHOXAZOLE-TRIMETHOPRIM 800-160 MG PO TABS
1.0000 | ORAL_TABLET | Freq: Two times a day (BID) | ORAL | 0 refills | Status: DC
  Filled 2023-03-20 (×2): qty 20, 10d supply, fill #0

## 2023-03-20 NOTE — Telephone Encounter (Signed)
Laura Wall reports a greater than 7-day history of worsening upper respiratory symptoms.  She notes that the symptoms started with fatigue, cough, nasal pressure and congestion, they have continued to worsen.  She notes dark discharge as well as some epistaxis.  She also notes losing her voice.  She denies fever.  Given the progressive symptoms I do think it is reasonable to start her on a course of antibiotics, I will place her on Bactrim DS 2 times daily x 10 days, Medrol Dosepak, and omeprazole.  She understands that she will need to follow-up if she develops any new or worsening signs or symptoms.  She verbalized understanding and agreement to today's plan.

## 2023-04-03 ENCOUNTER — Other Ambulatory Visit (HOSPITAL_COMMUNITY): Payer: Self-pay

## 2023-04-03 ENCOUNTER — Telehealth: Payer: Self-pay | Admitting: Physician Assistant

## 2023-04-03 MED ORDER — PREDNISONE 20 MG PO TABS
ORAL_TABLET | ORAL | 0 refills | Status: DC
  Filled 2023-04-03: qty 7, 7d supply, fill #0

## 2023-04-03 NOTE — Telephone Encounter (Signed)
I spoke with Ms. Dippolito today.  She continues to have pain throughout the left preauricular region predominantly posterior to the mastoid in the occipital region as well.  This is slightly worse with palpation, no palpable masses.  She has no signs of active infection at this time.  She has completed a course of antibiotics.  Case was discussed with Dr. Jearld Fenton, we will plan to treat with 7 days of 20 mg a day prednisone.  If she does not improve we will consider further imaging.

## 2023-04-09 DIAGNOSIS — Z1231 Encounter for screening mammogram for malignant neoplasm of breast: Secondary | ICD-10-CM | POA: Diagnosis not present

## 2023-04-10 ENCOUNTER — Ambulatory Visit (INDEPENDENT_AMBULATORY_CARE_PROVIDER_SITE_OTHER): Payer: Self-pay | Admitting: Physician Assistant

## 2023-04-10 DIAGNOSIS — L814 Other melanin hyperpigmentation: Secondary | ICD-10-CM

## 2023-04-10 NOTE — Progress Notes (Signed)
Preoperative Dx: hirsutism   Postoperative Dx:  same  Procedure: laser to face  Anesthesia: none  Description of Procedure:  Risks and complications were explained to the patient. Consent was confirmed and signed. Time out was called and all information was confirmed to be correct. The area  area was prepped with alcohol and wiped dry. The site time laser was set with the 640 filter with a fluence of 14 J/cm with a width of 15.  The face was lasered. The patient tolerated the procedure well and there were no complications. The patient is to follow up in 4 weeks.  Preoperative Dx: solar lentigo  Postoperative Dx:  same  Procedure: laser to bilateral arms   Anesthesia: none  Description of Procedure:  Risks and complications were explained to the patient. Consent was confirmed and signed. Time out was called and all information was confirmed to be correct. The area  area was prepped with alcohol and wiped dry. The Sciton laser was set with the 515 laser with 7.0 J/cm2 with width 20.  The arms was lasered. The patient tolerated the procedure well and there were no complications. The patient is to follow up in 4 weeks.

## 2023-04-13 ENCOUNTER — Other Ambulatory Visit (HOSPITAL_COMMUNITY): Payer: Self-pay

## 2023-04-15 ENCOUNTER — Other Ambulatory Visit (HOSPITAL_COMMUNITY): Payer: Self-pay

## 2023-04-15 MED ORDER — MELOXICAM 15 MG PO TABS
15.0000 mg | ORAL_TABLET | ORAL | 2 refills | Status: DC
  Filled 2023-04-15: qty 30, 60d supply, fill #0
  Filled 2023-08-13: qty 30, 60d supply, fill #1
  Filled 2023-09-23 – 2023-10-01 (×4): qty 30, 60d supply, fill #2

## 2023-05-17 ENCOUNTER — Other Ambulatory Visit (HOSPITAL_COMMUNITY): Payer: Self-pay

## 2023-05-17 ENCOUNTER — Other Ambulatory Visit (INDEPENDENT_AMBULATORY_CARE_PROVIDER_SITE_OTHER): Payer: Self-pay | Admitting: Physician Assistant

## 2023-05-17 MED ORDER — FLUTICASONE PROPIONATE 50 MCG/ACT NA SUSP
1.0000 | Freq: Every day | NASAL | 2 refills | Status: DC
  Filled 2023-05-17: qty 16, 30d supply, fill #0
  Filled 2023-08-13: qty 16, 30d supply, fill #1

## 2023-05-17 MED ORDER — DESLORATADINE 5 MG PO TABS
5.0000 mg | ORAL_TABLET | Freq: Every day | ORAL | 2 refills | Status: DC
  Filled 2023-05-17: qty 30, 30d supply, fill #0
  Filled 2023-08-13: qty 30, 30d supply, fill #1

## 2023-05-17 MED ORDER — SULFAMETHOXAZOLE-TRIMETHOPRIM 800-160 MG PO TABS
1.0000 | ORAL_TABLET | Freq: Two times a day (BID) | ORAL | 0 refills | Status: DC
  Filled 2023-05-17: qty 28, 14d supply, fill #0

## 2023-05-17 MED ORDER — METHYLPREDNISOLONE 4 MG PO TBPK
ORAL_TABLET | ORAL | 0 refills | Status: DC
  Filled 2023-05-17: qty 21, 6d supply, fill #0

## 2023-05-17 MED ORDER — FAMOTIDINE 20 MG PO TABS
20.0000 mg | ORAL_TABLET | Freq: Two times a day (BID) | ORAL | 1 refills | Status: DC
  Filled 2023-05-17: qty 60, 30d supply, fill #0
  Filled 2023-08-13: qty 60, 30d supply, fill #1

## 2023-05-17 NOTE — Progress Notes (Signed)
Prescriptions sent for chronic laryngitis

## 2023-05-23 ENCOUNTER — Other Ambulatory Visit (HOSPITAL_BASED_OUTPATIENT_CLINIC_OR_DEPARTMENT_OTHER): Payer: Self-pay

## 2023-05-30 ENCOUNTER — Other Ambulatory Visit (HOSPITAL_COMMUNITY): Payer: Self-pay

## 2023-05-30 DIAGNOSIS — M19049 Primary osteoarthritis, unspecified hand: Secondary | ICD-10-CM | POA: Diagnosis not present

## 2023-05-30 DIAGNOSIS — R42 Dizziness and giddiness: Secondary | ICD-10-CM | POA: Diagnosis not present

## 2023-05-30 DIAGNOSIS — R7303 Prediabetes: Secondary | ICD-10-CM | POA: Diagnosis not present

## 2023-05-30 DIAGNOSIS — E7800 Pure hypercholesterolemia, unspecified: Secondary | ICD-10-CM | POA: Diagnosis not present

## 2023-05-30 MED ORDER — MELOXICAM 15 MG PO TABS
15.0000 mg | ORAL_TABLET | ORAL | 2 refills | Status: DC
  Filled 2023-06-11: qty 30, 60d supply, fill #0
  Filled 2023-08-13: qty 30, 60d supply, fill #1

## 2023-06-11 ENCOUNTER — Other Ambulatory Visit: Payer: Self-pay

## 2023-06-11 ENCOUNTER — Other Ambulatory Visit (HOSPITAL_COMMUNITY): Payer: Self-pay

## 2023-07-18 ENCOUNTER — Ambulatory Visit (INDEPENDENT_AMBULATORY_CARE_PROVIDER_SITE_OTHER): Payer: Self-pay | Admitting: Student

## 2023-07-18 DIAGNOSIS — Z719 Counseling, unspecified: Secondary | ICD-10-CM

## 2023-07-18 NOTE — Progress Notes (Signed)
Preoperative Dx: hair to the face, rosacea, hyperpigmentation   Postoperative Dx:  same  Procedure: laser to face   Anesthesia: none  Description of Procedure:  Risks and complications were explained to the patient. Consent was confirmed and signed. Time out was called and all information was confirmed to be correct. The area  area was prepped with alcohol and wiped dry. The Forever Bare laser was set at 16-17 J/cm2 using 640 nm filter with 15 width and 15 degrees Celsius. The face and neck were lasered. The BBL Lase was then set to 2 J/cm2 using the 532 filter with a 3 pulse width and gel temperature of 25 C. The face was lasered.  The BBL laser was then set to 13 to 14 J/cm, and using the 560 nm filter with a pulse width of 20 and a chill temperature of 15 C, the cheeks were lasered for rosacea.  The BBL laser was then set to 8 J/cm using the 560 nm filter with a pulse width of 20-10 and a chill temperature of 15 C, the facial vessels were lasered.  The BBL laser was then set to 7 J/cm, and using the 532 nm filter with a pulse width of 20 and a chill temperature of 15, the pigment to the face were lasered.  The patient tolerated the procedure well and there were no complications. The patient is to follow up in 4 weeks.

## 2023-07-20 IMAGING — CT CT CARDIAC CORONARY ARTERY CALCIUM SCORE
3 series · 14 of 20 positions shown, 16 images · non-contrast
Comparison: None Available.
COMPARISON: None Available.

Addendum:
CLINICAL DATA: This over-read does not include interpretation of
cardiac or coronary anatomy or pathology. The coronary calcium score
interpretation by the cardiologist is attached.
CLINICAL DATA: 61F for cardiovascular disease risk stratification

EXAM:
Coronary Calcium Score
TECHNIQUE: A gated, non-contrast computed tomography scan of the heart was
performed using 3mm slice thickness. Axial images were analyzed on a
dedicated workstation. Calcium scoring of the coronary arteries was
performed using the Agatston method.

[Series 2: ax lung · axial · 0.81mm/px · z∈[+1248,+1338]mm · 5 of 69 slices shown]
[im 12/69  lung]
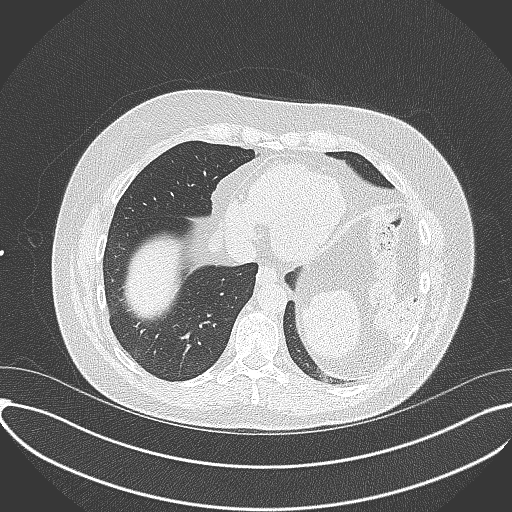
[im 23/69  lung]
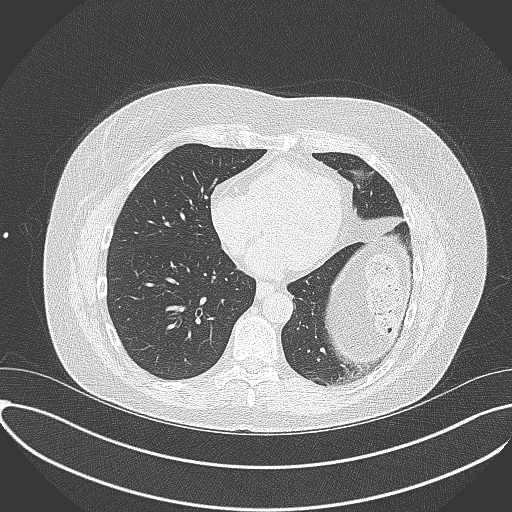
[im 35/69  lung]
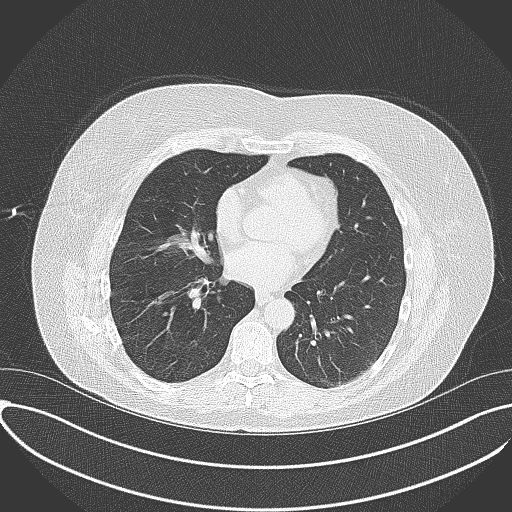
[im 46/69  lung]
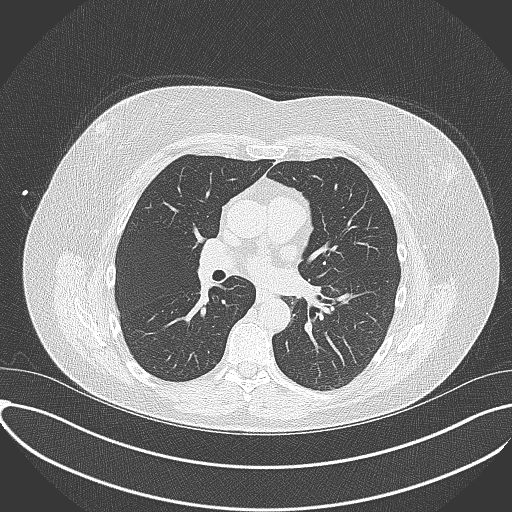
[im 57/69  lung]
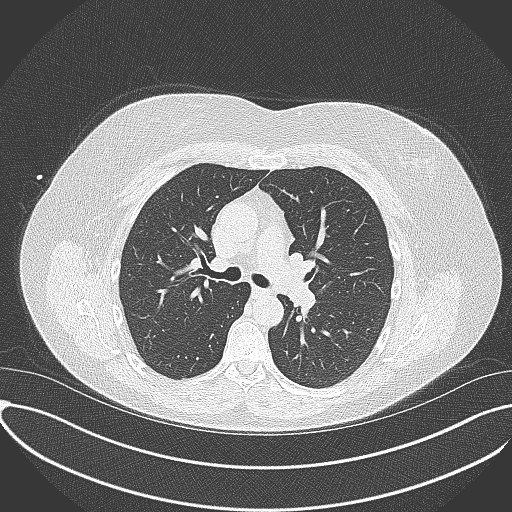

[Series 3: cascseq 3.0 sa36 70% (id) · axial · 0.39mm/px · z∈[+1259,+1325]mm · 3 of 46 slices shown]
[im 12/46  vessel]
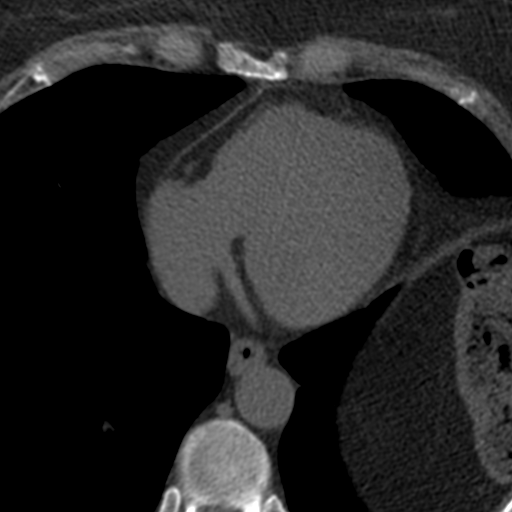
[im 23/46  vessel]
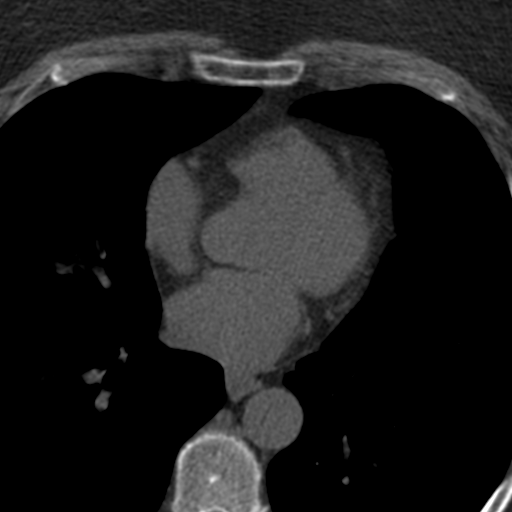
[im 34/46  vessel]
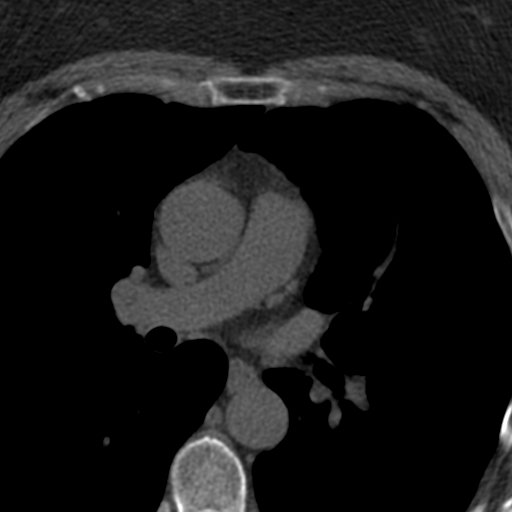

[Series 4: ax st · axial · 0.72mm/px · z∈[+1244,+1342]mm · 6 of 69 slices shown, 8 images]
[im 10/69  vessel]
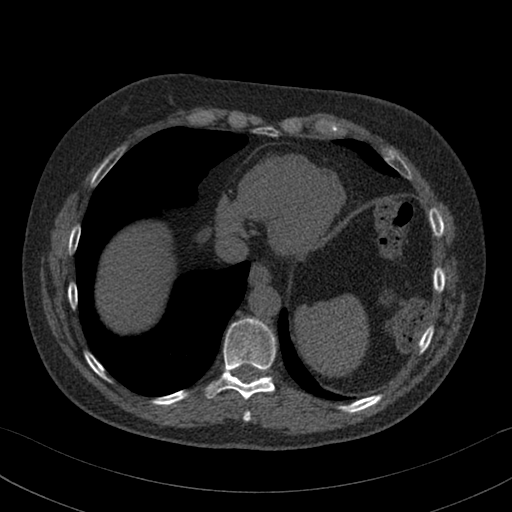
[im 10/69  lung]
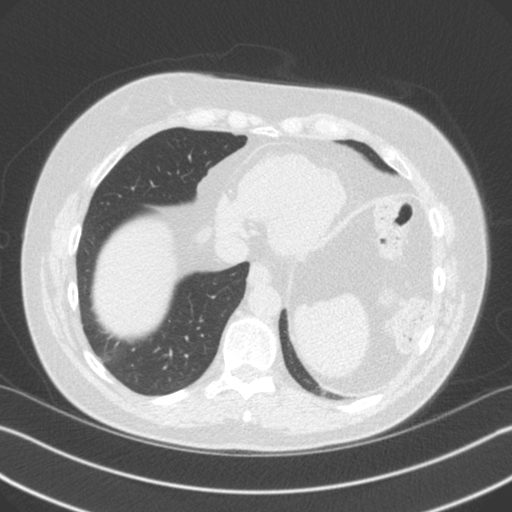
[im 20/69  vessel]
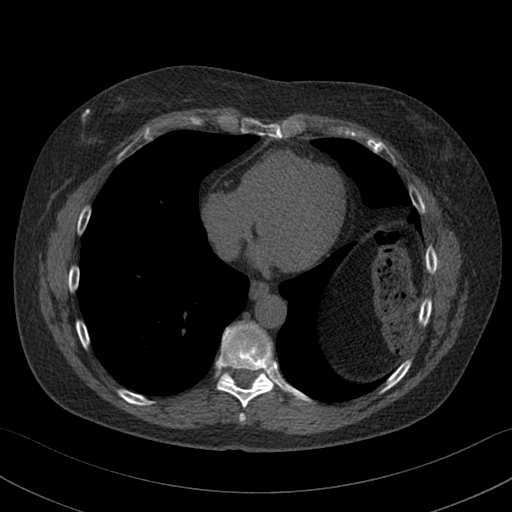
[im 30/69  vessel]
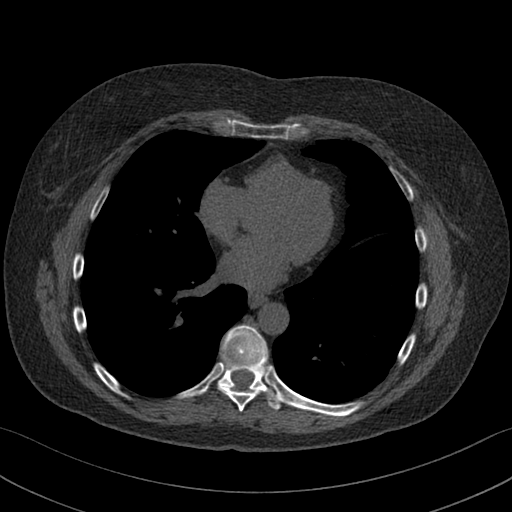
[im 39/69  vessel]
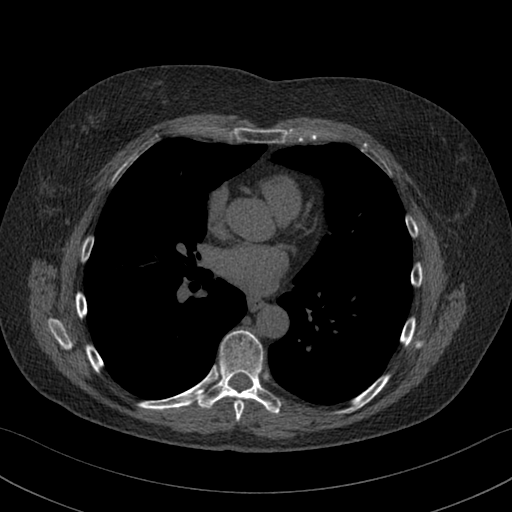
[im 49/69  vessel]
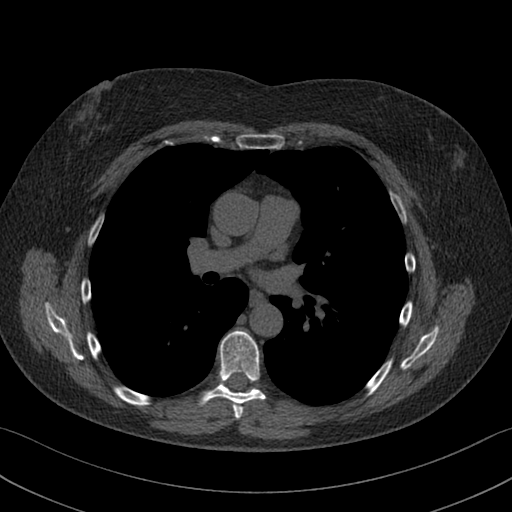
[im 49/69  lung]
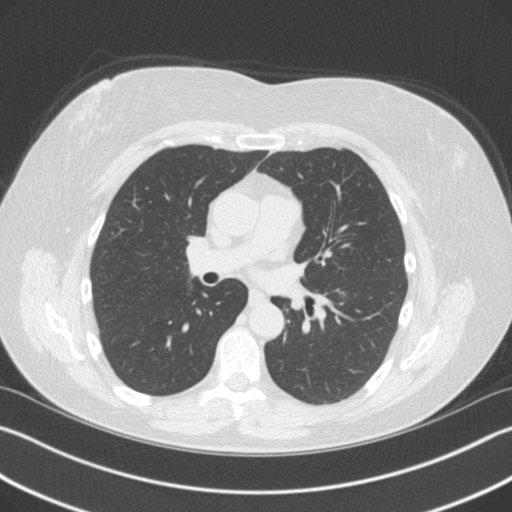
[im 59/69  vessel]
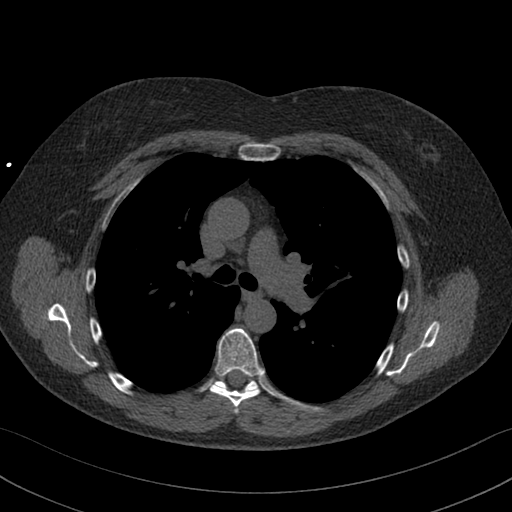

[14 of 20 positions shown; findings below may reference images not displayed]

FINDINGS: Vascular: High takeoff of the RCA. No significant extracardiac
vascular findings.

Mediastinum/Nodes: No lymphadenopathy.

Lungs/Pleura: There is a 5 mm part solid nodule in the left lateral
lower lobe (series 2, image 28). Elevated left hemidiaphragm.

Upper Abdomen: No acute abnormality. Diverticulosis of the splenic
flexure.

Musculoskeletal: No acute osseous abnormality. No suspicious lytic
or blastic lesions.
IMPRESSION: 5 mm part solid nodule in the left lower lobe. No follow-up
recommended. This recommendation follows the consensus statement:
Guidelines for Management of Incidental Pulmonary Nodules Detected

Interpretation of cardiac/coronary anatomy by cardiology to follow.
FINDINGS: Coronary arteries: Normal origins.

Coronary Calcium Score: 0

Pericardium: Normal.

Ascending Aorta: Normal caliber.  2.9 cm.  Aortic atherosclerosis.

Non-cardiac: See separate report from [REDACTED].
IMPRESSION: Coronary calcium score of 0. This was 0 percentile for age-, race-,
and sex-matched controls.

Mild aortic atherosclerosis.



If CAC=0, it is reasonable to withhold statin therapy and reassess
in 5 to 10 years, as long as higher risk conditions are absent
(diabetes mellitus, family history of premature CHD in first degree
relatives (males <55 years; females <65 years), cigarette smoking,
or LDL >=190 mg/dL).

If CAC is 1 to 99, it is reasonable to initiate statin therapy for
patients >=55 years of age.

If CAC is >=100 or >=75th percentile, it is reasonable to initiate
statin therapy at any age.

Cardiology referral should be considered for patients with CAC
scores >=400 or >=75th percentile.

*6253 AHA/ACC/AACVPR/AAPA/ABC/KAKI/STAPLETON/JIM/Euphemia/PARKIR/CHIO/TIGER
Guideline on the Management of Blood Cholesterol: A Report of the
American College of Cardiology/American Heart Association Task Force
on Clinical Practice Guidelines. J Am Coll Cardiol.
5538;73(24):1578-1141.

*** End of Addendum ***
FINDINGS: Vascular: High takeoff of the RCA. No significant extracardiac
vascular findings.

Mediastinum/Nodes: No lymphadenopathy.

Lungs/Pleura: There is a 5 mm part solid nodule in the left lateral
lower lobe (series 2, image 28). Elevated left hemidiaphragm.

Upper Abdomen: No acute abnormality. Diverticulosis of the splenic
flexure.

Musculoskeletal: No acute osseous abnormality. No suspicious lytic
or blastic lesions.
IMPRESSION: 5 mm part solid nodule in the left lower lobe. No follow-up
recommended. This recommendation follows the consensus statement:
Guidelines for Management of Incidental Pulmonary Nodules Detected

Interpretation of cardiac/coronary anatomy by cardiology to follow.

## 2023-07-23 ENCOUNTER — Other Ambulatory Visit (HOSPITAL_COMMUNITY): Payer: Self-pay

## 2023-07-23 DIAGNOSIS — Z01419 Encounter for gynecological examination (general) (routine) without abnormal findings: Secondary | ICD-10-CM | POA: Diagnosis not present

## 2023-07-23 DIAGNOSIS — D649 Anemia, unspecified: Secondary | ICD-10-CM | POA: Diagnosis not present

## 2023-07-23 DIAGNOSIS — Z6825 Body mass index (BMI) 25.0-25.9, adult: Secondary | ICD-10-CM | POA: Diagnosis not present

## 2023-07-23 DIAGNOSIS — Z139 Encounter for screening, unspecified: Secondary | ICD-10-CM | POA: Diagnosis not present

## 2023-07-23 DIAGNOSIS — Z1211 Encounter for screening for malignant neoplasm of colon: Secondary | ICD-10-CM | POA: Diagnosis not present

## 2023-07-23 DIAGNOSIS — Z1231 Encounter for screening mammogram for malignant neoplasm of breast: Secondary | ICD-10-CM | POA: Diagnosis not present

## 2023-07-23 MED ORDER — FOLIVANE-PLUS PO CAPS
1.0000 | ORAL_CAPSULE | Freq: Every day | ORAL | 3 refills | Status: DC
  Filled 2023-07-23 – 2023-08-13 (×3): qty 90, 90d supply, fill #0

## 2023-07-24 ENCOUNTER — Other Ambulatory Visit (HOSPITAL_COMMUNITY): Payer: Self-pay

## 2023-08-13 ENCOUNTER — Other Ambulatory Visit: Payer: Self-pay | Admitting: Physician Assistant

## 2023-08-14 ENCOUNTER — Other Ambulatory Visit: Payer: Self-pay

## 2023-08-14 ENCOUNTER — Other Ambulatory Visit (HOSPITAL_COMMUNITY): Payer: Self-pay

## 2023-08-14 MED ORDER — OMEPRAZOLE 20 MG PO CPDR
20.0000 mg | DELAYED_RELEASE_CAPSULE | Freq: Every day | ORAL | 0 refills | Status: DC
  Filled 2023-08-14: qty 30, 30d supply, fill #0

## 2023-08-15 ENCOUNTER — Other Ambulatory Visit: Payer: Self-pay

## 2023-08-15 ENCOUNTER — Other Ambulatory Visit (HOSPITAL_COMMUNITY): Payer: Self-pay

## 2023-08-19 ENCOUNTER — Other Ambulatory Visit (HOSPITAL_COMMUNITY): Payer: Self-pay

## 2023-08-20 DIAGNOSIS — M19049 Primary osteoarthritis, unspecified hand: Secondary | ICD-10-CM | POA: Diagnosis not present

## 2023-08-20 DIAGNOSIS — E559 Vitamin D deficiency, unspecified: Secondary | ICD-10-CM | POA: Diagnosis not present

## 2023-08-20 DIAGNOSIS — E7800 Pure hypercholesterolemia, unspecified: Secondary | ICD-10-CM | POA: Diagnosis not present

## 2023-08-20 DIAGNOSIS — R7303 Prediabetes: Secondary | ICD-10-CM | POA: Diagnosis not present

## 2023-08-20 DIAGNOSIS — F988 Other specified behavioral and emotional disorders with onset usually occurring in childhood and adolescence: Secondary | ICD-10-CM | POA: Diagnosis not present

## 2023-08-20 DIAGNOSIS — Z0000 Encounter for general adult medical examination without abnormal findings: Secondary | ICD-10-CM | POA: Diagnosis not present

## 2023-08-26 ENCOUNTER — Emergency Department (HOSPITAL_COMMUNITY)
Admission: EM | Admit: 2023-08-26 | Discharge: 2023-08-26 | Disposition: A | Discharge status: Home / Self Care | Payer: Commercial Managed Care - PPO | Attending: Emergency Medicine | Admitting: Emergency Medicine

## 2023-08-26 ENCOUNTER — Other Ambulatory Visit: Payer: Self-pay

## 2023-08-26 ENCOUNTER — Emergency Department (HOSPITAL_COMMUNITY): Payer: Commercial Managed Care - PPO

## 2023-08-26 DIAGNOSIS — R55 Syncope and collapse: Secondary | ICD-10-CM | POA: Insufficient documentation

## 2023-08-26 DIAGNOSIS — R519 Headache, unspecified: Secondary | ICD-10-CM | POA: Diagnosis not present

## 2023-08-26 DIAGNOSIS — R29818 Other symptoms and signs involving the nervous system: Secondary | ICD-10-CM | POA: Diagnosis not present

## 2023-08-26 DIAGNOSIS — R531 Weakness: Secondary | ICD-10-CM | POA: Diagnosis not present

## 2023-08-26 DIAGNOSIS — Z91040 Latex allergy status: Secondary | ICD-10-CM | POA: Diagnosis not present

## 2023-08-26 DIAGNOSIS — I10 Essential (primary) hypertension: Secondary | ICD-10-CM | POA: Diagnosis not present

## 2023-08-26 DIAGNOSIS — D582 Other hemoglobinopathies: Secondary | ICD-10-CM | POA: Diagnosis not present

## 2023-08-26 DIAGNOSIS — R200 Anesthesia of skin: Secondary | ICD-10-CM | POA: Insufficient documentation

## 2023-08-26 DIAGNOSIS — R202 Paresthesia of skin: Secondary | ICD-10-CM | POA: Diagnosis not present

## 2023-08-26 DIAGNOSIS — R000 Tachycardia, unspecified: Secondary | ICD-10-CM | POA: Diagnosis not present

## 2023-08-26 DIAGNOSIS — R29898 Other symptoms and signs involving the musculoskeletal system: Secondary | ICD-10-CM | POA: Diagnosis not present

## 2023-08-26 LAB — URINALYSIS, ROUTINE W REFLEX MICROSCOPIC
Bilirubin Urine: NEGATIVE
Glucose, UA: NEGATIVE mg/dL
Hgb urine dipstick: NEGATIVE
Ketones, ur: NEGATIVE mg/dL
Leukocytes,Ua: NEGATIVE
Nitrite: NEGATIVE
Protein, ur: NEGATIVE mg/dL
Specific Gravity, Urine: 1.008 (ref 1.005–1.030)
pH: 7 (ref 5.0–8.0)

## 2023-08-26 LAB — CBC WITH DIFFERENTIAL/PLATELET
Abs Immature Granulocytes: 0.02 10*3/uL (ref 0.00–0.07)
Basophils Absolute: 0.1 10*3/uL (ref 0.0–0.1)
Basophils Relative: 1 %
Eosinophils Absolute: 0.1 10*3/uL (ref 0.0–0.5)
Eosinophils Relative: 2 %
HCT: 44.7 % (ref 36.0–46.0)
Hemoglobin: 15.1 g/dL — ABNORMAL HIGH (ref 12.0–15.0)
Immature Granulocytes: 0 %
Lymphocytes Relative: 37 %
Lymphs Abs: 2.4 10*3/uL (ref 0.7–4.0)
MCH: 29.2 pg (ref 26.0–34.0)
MCHC: 33.8 g/dL (ref 30.0–36.0)
MCV: 86.5 fL (ref 80.0–100.0)
Monocytes Absolute: 0.8 10*3/uL (ref 0.1–1.0)
Monocytes Relative: 12 %
Neutro Abs: 3.1 10*3/uL (ref 1.7–7.7)
Neutrophils Relative %: 48 %
Platelets: 332 10*3/uL (ref 150–400)
RBC: 5.17 MIL/uL — ABNORMAL HIGH (ref 3.87–5.11)
RDW: 12 % (ref 11.5–15.5)
WBC: 6.5 10*3/uL (ref 4.0–10.5)
nRBC: 0 % (ref 0.0–0.2)

## 2023-08-26 LAB — TROPONIN I (HIGH SENSITIVITY)
Troponin I (High Sensitivity): 7 ng/L (ref <18)
Troponin I (High Sensitivity): 11 ng/L (ref <18)

## 2023-08-26 LAB — COMPREHENSIVE METABOLIC PANEL
ALT: 32 U/L (ref 0–44)
AST: 22 U/L (ref 15–41)
Albumin: 3.8 g/dL (ref 3.5–5.0)
Alkaline Phosphatase: 85 U/L (ref 38–126)
Anion gap: 7 (ref 5–15)
BUN: 11 mg/dL (ref 8–23)
CO2: 26 mmol/L (ref 22–32)
Calcium: 8.7 mg/dL — ABNORMAL LOW (ref 8.9–10.3)
Chloride: 106 mmol/L (ref 98–111)
Creatinine, Ser: 0.81 mg/dL (ref 0.44–1.00)
GFR, Estimated: >60 mL/min (ref >60)
Glucose, Bld: 88 mg/dL (ref 70–99)
Potassium: 4.3 mmol/L (ref 3.5–5.1)
Sodium: 139 mmol/L (ref 135–145)
Total Bilirubin: 0.3 mg/dL (ref 0.3–1.2)
Total Protein: 6.2 g/dL — ABNORMAL LOW (ref 6.5–8.1)

## 2023-08-26 MED ORDER — IOHEXOL 350 MG/ML SOLN
75.0000 mL | Freq: Once | INTRAVENOUS | Status: AC | PRN
  Administered 2023-08-26: 75 mL via INTRAVENOUS

## 2023-08-26 MED ORDER — IBUPROFEN 400 MG PO TABS
400.0000 mg | ORAL_TABLET | Freq: Once | ORAL | Status: AC
  Administered 2023-08-26: 400 mg via ORAL
  Filled 2023-08-26: qty 1

## 2023-08-26 NOTE — ED Provider Notes (Signed)
  Physical Exam  BP 122/80   Pulse 78   Temp 98.3 F (36.8 C)   Resp 20   Ht 5\' 10"  (1.778 m)   Wt 78.9 kg   SpO2 100%   BMI 24.97 kg/m   Physical Exam per MDM below  Procedures  Procedures  ED Course / MDM    Medical Decision Making Amount and/or Complexity of Data Reviewed Labs: ordered. Radiology: ordered.  Risk Prescription drug management.   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 Getting a CTA H/N. Had a HA after this happened but not before. Able to ambulate, improved after 15-20 min, now back to normal. Also having palpitations and high HR on watch, so outpatient cardiology referral has been placed. Troponin, EKG were fine. IF CT H/N ok, can discharge with PCP f/u.   CTA head neck without evidence of acute abnormality. I reassessed patient. She is without symptoms at this time. Neuro exam normal. Stable and appropriate for d/c with outpatient f/u with PCP, and cardiology as already discussed/arranged by offgoing provider. I discussed imaging and lab findings with patient. She is comfortable with discharge and plan for follow up. Strict return precautions discussed. Discharged in stable condition.        Karmen Stabs, MD 08/26/23 1851    Melene Plan, DO 08/26/23 [phone]

## 2023-08-26 NOTE — ED Triage Notes (Addendum)
Pt BIB [facility] from work. Pt reports acute weakness and numbness in both legs about 30 minutes ago. Denies lifting anything heavy recently or any other lumbar strains. Pt reports having a headache earlier. Denies pain.  VSS 152 CBG

## 2023-08-26 NOTE — ED Notes (Signed)
Patient transported to CT 

## 2023-08-26 NOTE — ED Provider Notes (Signed)
[place] EMERGENCY DEPARTMENT AT [facility] Provider Note   CSN: [id number] Arrival date & time: 08/26/23  1153     History  Chief Complaint  Patient presents with   Numbness    Laura Wall is a 62 y.o. female.  Patient presents to the emergency department for evaluation of lower extremity numbness.  Patient was at work at an office job.  She was sitting in a chair for approximately 30 minutes and then stood up.  She took a couple steps and realize that she could not feel her legs below her hips.  This was bilateral.  She called for assistance.  Her blood pressure was checked.  EMS was called.  Over about 20 minutes, her symptoms resolved.  She reports generalized weakness that persisted including in her bilateral upper extremities.  She never lost sensation in her upper extremities.  Patient denies signs of stroke including: facial droop, slurred speech, aphasia, weakness/numbness in extremities, imbalance/trouble walking.  No time did she have chest pain or shortness of breath.  No abdominal pain or vomiting.  No syncope.  Patient did have a headache this morning.  She does not recall having a headache during episode of numbness but afterwards she has had pain that has been worse behind the left eye.  She states that headaches are atypical for her.  No neck, middle or lower back pains.  No history of surgeries or disc issues.  She does report some occasional spikes in her heart rate up into the 140s that she has noted on her exercise watch.  Patient does report she has been under a lot of stress with her job recently.        Home Medications Prior to Admission medications   Medication Sig Start Date End Date Taking? Authorizing Provider  albuterol (PROVENTIL HFA) 108 (90 Base) MCG/ACT inhaler Inhale 2 puffs into the lungs every 4 (four) hours as needed for wheezing 03/23/22     amphetamine-dextroamphetamine (ADDERALL XR) 20 MG 24 hr capsule Take 1 capsule (20 mg total) by  mouth in the morning. 06/28/21     azithromycin (ZITHROMAX Z-PAK) 250 MG tablet Follow package directions. 12/27/22   Locklear, Darryl, DMD  clindamycin (CLEOCIN) 150 MG capsule Take 2 capsules by mouth now, then Take 1 capsule (150 mg total) by mouth 3 (three) times daily for dental infection. 06/19/22     clindamycin (CLEOCIN) 150 MG capsule Take 2 capsules (300 mg total) by mouth now AND 1 capsule (150 mg total) 3 (three) times daily for dental infection 10/02/22     conjugated estrogens (PREMARIN) vaginal cream Insert pea-sized (0.5g) amount into vagina every other night 09/15/19   [provider]  desloratadine (CLARINEX) 5 MG tablet Take 1 tablet (5 mg total) by mouth daily. 05/17/23 05/16/24  Hedges, Tinnie Gens, PA-C  diazepam (VALIUM) 10 MG tablet as needed (vertigo).    [provider]  famotidine (PEPCID) 20 MG tablet Take 1 tablet (20 mg total) by mouth 2 (two) times daily. 05/17/23 05/16/24  Hedges, Tinnie Gens, PA-C  FeFum-FePoly-FA-B Cmp-C-Biot [facility]) CAPS Take 1 capsule by mouth daily. 11/23/21     FeFum-FePoly-FA-B Cmp-C-Biot (FOLIVANE-PLUS) CAPS Take 1 capsule by mouth daily. 03/23/22     FeFum-FePoly-FA-B Cmp-C-Biot (FOLIVANE-PLUS) CAPS Take 1 capsule every day by oral route. 08/20/22     FeFum-FePoly-FA-B Cmp-C-Biot (FOLIVANE-PLUS) CAPS Take 1 capsule by mouth every day. 07/23/23     fluticasone (FLONASE) 50 MCG/ACT nasal spray Place 1 spray into both nostrils  daily. 05/17/23 05/16/24  Hedges, Tinnie Gens, PA-C  lidocaine in alum & mag hydroxide-simeth suspension Swish with 2 tablespoon (30 ml), 2-3 times per day. 07/31/22   Locklear, Darryl, DMD  meloxicam (MOBIC) 15 MG tablet Take 1 tablet (15 mg total) by mouth daily. 02/15/22     meloxicam (MOBIC) 15 MG tablet Take 1 tablet (15 mg total) by mouth daily. 03/23/22     meloxicam (MOBIC) 15 MG tablet Take 1 tablet (15 mg total) by mouth every other day. 04/15/23     meloxicam (MOBIC) 15 MG tablet Take 1 tablet (15 mg total) by  mouth every other day. 05/30/23     methylPREDNISolone (MEDROL DOSEPAK) 4 MG TBPK tablet Please follow instructions on package 05/17/23   Hedges, Tinnie Gens, PA-C  omeprazole (PRILOSEC) 20 MG capsule Take 1 capsule (20 mg total) by mouth daily. 08/14/23   Ashok Croon, MD  ondansetron (ZOFRAN) 4 MG tablet Take 1 tablet (4 mg total) by mouth every 6 (six) hours as needed. Take 1 hour before a meal. 07/16/22     prednisoLONE acetate (PREDNISOLONE ACETATE P-F) 1 % ophthalmic suspension Place 1 drop into both eyes 4 (four) times daily. 12/06/21     predniSONE (DELTASONE) 20 MG tablet Take one tablet daily in the morning for 7 days 04/03/23   Hedges, Tinnie Gens, PA-C  Semaglutide-Weight Management (WEGOVY) 0.25 MG/0.5ML SOAJ Inject 0.25 mg into the skin once a week. 07/16/22     Semaglutide-Weight Management (WEGOVY) 0.25 MG/0.5ML SOAJ Inject 0.25 milligrams under the skin once weekly. 07/19/22     Semaglutide-Weight Management (WEGOVY) 0.5 MG/0.5ML SOAJ Inject 0.5 mg into the skin once a week. 08/09/22     Semaglutide-Weight Management (WEGOVY) 1 MG/0.5ML SOAJ Inject 1 mg into the skin once a week. 08/25/22     Semaglutide-Weight Management (WEGOVY) 1.7 MG/0.75ML SOAJ Inject 1.7 mg into the skin once a week. 09/27/22     Semaglutide-Weight Management (WEGOVY) 1.7 MG/0.75ML SOAJ Inject 1.7 mg into the skin once a week. 12/26/22     Semaglutide-Weight Management (WEGOVY) 1.7 MG/0.75ML SOAJ Inject 1.7 mg into the skin once a week. 01/21/23     Semaglutide-Weight Management (WEGOVY) 1.7 MG/0.75ML SOAJ Inject 1.7 mg into the skin once a week. 01/31/23     sulfamethoxazole-trimethoprim (BACTRIM DS) 800-160 MG tablet Take 1 tablet by mouth 2 (two) times daily. 05/17/23   Hedges, Tinnie Gens, PA-C  valACYclovir (VALTREX) 1000 MG tablet Take 1 tablet (1,000 mg total) by mouth 2 (two) times daily. 01/15/23   Hedges, Tinnie Gens, PA-C  VITAMIN D, CHOLECALCIFEROL, PO Vitamin D    [provider]  Vitamin D, Ergocalciferol,  (DRISDOL) 1.25 MG (50000 UNIT) CAPS capsule Take 1 capsule (50,000 Units total) by mouth once a week. 07/19/22         Allergies    Codeine, Latex, Penicillins, and Sulfa antibiotics    Review of Systems   Review of Systems  Physical Exam Updated Vital Signs BP 124/82   Pulse 71   Temp 98.2 F (36.8 C) (Oral)   Resp 17   Ht 5\' 10"  (1.778 m)   Wt 78.9 kg   SpO2 97%   BMI 24.97 kg/m   Physical Exam Vitals and nursing note reviewed.  Constitutional:      Appearance: She is well-developed. She is not diaphoretic.  HENT:     Head: Normocephalic and atraumatic.     Right Ear: External ear normal.     Left Ear: External ear normal.  Nose: Nose normal.     Mouth/Throat:     Mouth: Mucous membranes are not dry.     Pharynx: Uvula midline.  Eyes:     General: Lids are normal.     Extraocular Movements:     Right eye: No nystagmus.     Left eye: No nystagmus.     Conjunctiva/sclera: Conjunctivae normal.     Pupils: Pupils are equal, round, and reactive to light.  Neck:     Vascular: Normal carotid pulses. No JVD.     Trachea: Trachea normal. No tracheal deviation.  Cardiovascular:     Rate and Rhythm: Normal rate and regular rhythm.     Pulses: No decreased pulses.          Radial pulses are 2+ on the right side and 2+ on the left side.     Heart sounds: Normal heart sounds, S1 normal and S2 normal. No murmur heard. Pulmonary:     Effort: Pulmonary effort is normal. No respiratory distress.     Breath sounds: Normal breath sounds. No wheezing.  Chest:     Chest wall: No tenderness.  Abdominal:     General: Bowel sounds are normal.     Palpations: Abdomen is soft.     Tenderness: There is no abdominal tenderness. There is no guarding or rebound.  Musculoskeletal:        General: Normal range of motion.     Cervical back: Normal range of motion and neck supple. No tenderness or bony tenderness. No muscular tenderness.  Skin:    General: Skin is warm and dry.      Coloration: Skin is not pale.  Neurological:     Mental Status: She is alert and oriented to person, place, and time.     GCS: GCS eye subscore is 4. GCS verbal subscore is 5. GCS motor subscore is 6.     Cranial Nerves: No cranial nerve deficit.     Sensory: No sensory deficit.     Motor: No weakness.     Coordination: Coordination normal.     Gait: Gait normal.     Comments: Upper extremity myotomes tested bilaterally:  C5 Shoulder abduction 5/5 C6 Elbow flexion/wrist extension 5/5 C7 Elbow extension 5/5 C8 Finger flexion 5/5 T1 Finger abduction 5/5  Lower extremity myotomes tested bilaterally: L2 Hip flexion 5/5 L3 Knee extension 5/5 L4 Ankle dorsiflexion 5/5 S1 Ankle plantar flexion 5/5      ED Results / Procedures / Treatments   Labs (all labs ordered are listed, but only abnormal results are displayed) Labs Reviewed  CBC WITH DIFFERENTIAL/PLATELET - Abnormal; Notable for the following components:      Result Value   RBC 5.17 (*)    Hemoglobin 15.1 (*)    All other components within normal limits  COMPREHENSIVE METABOLIC PANEL - Abnormal; Notable for the following components:   Calcium 8.7 (*)    Total Protein 6.2 (*)    All other components within normal limits  URINALYSIS, ROUTINE W REFLEX MICROSCOPIC  TROPONIN I (HIGH SENSITIVITY)  TROPONIN I (HIGH SENSITIVITY)    EKG EKG Interpretation Date/Time:  Monday August 26 2023 13:04:07 EDT Ventricular Rate:  72 PR Interval:  134 QRS Duration:  82 QT Interval:  420 QTC Calculation: 459 R Axis:   26  Text Interpretation: Normal sinus rhythm Low voltage QRS Cannot rule out Anterior infarct , age undetermined Abnormal ECG No old tracing to compare Confirmed by Melene Plan [phone]) on 08/26/2023  3:57:32 PM  Radiology No results found.  Procedures Procedures    Medications Ordered in ED Medications - No data to display  ED Course/ Medical Decision Making/ A&P    Patient seen and examined. History obtained  directly from patient.   Labs/EKG: Ordered CBC, CMP, troponin, EKG.  Imaging: Ordered CT angio head and neck  Medications/Fluids: None ordered  Most recent vital signs reviewed and are as follows: BP 124/82   Pulse 71   Temp 98.2 F (36.8 C) (Oral)   Resp 17   Ht 5\' 10"  (1.778 m)   Wt 78.9 kg   SpO2 97%   BMI 24.97 kg/m   Initial impression: Lower extremity numbness and generalized weakness.  Acute onset.  Symptoms were not unilateral and will be very atypical for stroke.  Headache is atypical and therefore CTA has been ordered.  Will check basic labs.  Will check troponin as well.  No back pain to suggest central cord type syndrome.  I have discussed case with Dr. Wilkie Aye, who will see patient.  3:46 PM Reassessment performed. Patient appears stable.   Labs personally reviewed and interpreted including: CBC with normal white blood cell count, hemoglobin elevated at 15.1; CMP unremarkable; troponin normal at 7.  Imaging personally visualized and interpreted including: Pending CTA head and neck  Most current vital signs reviewed and are as follows: BP 122/80   Pulse 78   Temp 98.2 F (36.8 C) (Oral)   Resp 20   Ht 5\' 10"  (1.778 m)   Wt 78.9 kg   SpO2 100%   BMI 24.97 kg/m   Plan: Awaiting completion of work-up.   3:48 PM Signout to oncoming providers at shift change.   Pt seen by Dr. Wilkie Aye, agrees if imaging is okay, likely d/c to home.  Outpatient cardiology referral placed on the patient's behalf.                                 Medical Decision Making Amount and/or Complexity of Data Reviewed Labs: ordered. Radiology: ordered.   In regards to the patient's headache, critical differentials were considered including subarachnoid hemorrhage, intracerebral hemorrhage, epidural/subdural hematoma, pituitary apoplexy, vertebral/carotid artery dissection, giant cell arteritis, central venous thrombosis, reversible cerebral vasoconstriction, acute angle closure  glaucoma, idiopathic intracranial hypertension, bacterial meningitis, viral encephalitis, carbon monoxide poisoning, posterior reversible encephalopathy syndrome, pre-eclampsia.   Reg flag symptoms related to these causes were considered including systemic symptoms (fever, weight loss), neurologic symptoms (confusion, mental status change, vision change, associated seizure), acute or sudden "thunderclap" onset, patient age 8 or older with new or progressive headache, patient of any age with first headache or change in headache pattern, pregnant or postpartum status, history of HIV or other immunocompromise, history of cancer, headache occurring with exertion, associated neck or shoulder pain, associated traumatic injury, concurrent use of anticoagulation, family history of spontaneous SAH, and concurrent drug use.    Other benign, more common causes of headache were considered including migraine, tension-type headache, cluster headache, referred pain from other cause such as sinus infection, dental pain, trigeminal neuralgia.   On exam, patient has a reassuring neuro exam including baseline mental status, no significant neck pain or meningeal signs, no signs of severe infection or fever.            Final Clinical Impression(s) / ED Diagnoses Final diagnoses:  Lower extremity numbness  Generalized weakness  Near syncope  Tachycardia  Rx / DC Orders ED Discharge Orders          Ordered    Ambulatory referral to Cardiology       Comments: If you have not heard from the Cardiology office within the next 72 hours please call [phone].   08/26/23 1548              Renne Crigler, PA-C 08/26/23 1610    Horton, Clabe Seal, DO 08/26/23 1612

## 2023-08-28 ENCOUNTER — Encounter: Payer: Self-pay | Admitting: Physician Assistant

## 2023-08-28 NOTE — Progress Notes (Signed)
I was asked to assist with facilitating sooner appointment with our cardiology team as post ER follow-up. Originally was scheduled for 11/13 with Dr. Flora Lipps -> per OK by Dr. Flora Lipps, we will move this up to 11/1 in 9:30am spot. Patient will be made aware to arrive 9:15am but encouraged to return to ED for any recurrent symptoms.

## 2023-08-30 ENCOUNTER — Encounter: Payer: Self-pay | Admitting: Cardiovascular Disease

## 2023-08-30 ENCOUNTER — Ambulatory Visit: Payer: Commercial Managed Care - PPO | Attending: Cardiovascular Disease | Admitting: Cardiovascular Disease

## 2023-08-30 ENCOUNTER — Ambulatory Visit (INDEPENDENT_AMBULATORY_CARE_PROVIDER_SITE_OTHER): Payer: Commercial Managed Care - PPO

## 2023-08-30 VITALS — BP 122/80 | HR 75 | Ht 70.0 in | Wt 179.2 lb

## 2023-08-30 DIAGNOSIS — E782 Mixed hyperlipidemia: Secondary | ICD-10-CM

## 2023-08-30 DIAGNOSIS — R002 Palpitations: Secondary | ICD-10-CM | POA: Diagnosis not present

## 2023-08-30 NOTE — Progress Notes (Unsigned)
Enrolled patient for a 7 day Zio XT monitor to be mailed to patients home.  

## 2023-08-30 NOTE — Patient Instructions (Signed)
Medication Instructions:  No changes    *If you need a refill on your cardiac medications before your next appointment, please call your pharmacy*   Lab Work:  None   If you have labs (blood work) drawn today and your tests are completely normal, you will receive your results only by: MyChart Message (if you have MyChart) OR A paper copy in the mail If you have any lab test that is abnormal or we need to change your treatment, we will call you to review the results.   Testing/Procedures: Laura Wall- Long Term Monitor Instructions  Your physician has requested you wear a ZIO patch monitor for 14 days.  This is a single patch monitor. [employer] supplies one patch monitor per enrollment. Additional stickers are not available. Please do not apply patch if you will be having a Nuclear Stress Test,  Echocardiogram, Cardiac CT, MRI, or Chest Xray during the period you would be wearing the  monitor. The patch cannot be worn during these tests. You cannot remove and re-apply the  ZIO XT patch monitor.  Your ZIO patch monitor will be mailed 3 day USPS to your address on file. It may take 3-5 days  to receive your monitor after you have been enrolled.  Once you have received your monitor, please review the enclosed instructions. Your monitor  has already been registered assigning a specific monitor serial # to you.  Billing and Patient Assistance Program Information  We have supplied [employer] with any of your insurance information on file for billing purposes. [employer] offers a sliding scale Patient Assistance Program for patients that do not have  insurance, or whose insurance does not completely cover the cost of the ZIO monitor.  You must apply for the Patient Assistance Program to qualify for this discounted rate.  To apply, please call [employer] at [phone], select option 4, select option 2, ask to apply for  Patient Assistance Program. [name] will ask your household income, and how many  people  are in your household. They will quote your out-of-pocket cost based on that information.  [employer] will also be able to set up a 36-month, interest-free payment plan if needed.  Applying the monitor   Shave hair from upper left chest.  Hold abrader disc by orange tab. Rub abrader in 40 strokes over the upper left chest as  indicated in your monitor instructions.  Clean area with 4 enclosed alcohol pads. Let dry.  Apply patch as indicated in monitor instructions. Patch will be placed under collarbone on left  side of chest with arrow pointing upward.  Rub patch adhesive wings for 2 minutes. Remove white label marked "1". Remove the white  label marked "2". Rub patch adhesive wings for 2 additional minutes.  While looking in a mirror, press and release button in center of patch. A small green light will  flash 3-4 times. This will be your only indicator that the monitor has been turned on.  Do not shower for the first 24 hours. You may shower after the first 24 hours.  Press the button if you feel a symptom. You will hear a small click. Record Date, Time and  Symptom in the Patient Logbook.  When you are ready to remove the patch, follow instructions on the last 2 pages of Patient  Logbook. Stick patch monitor onto the last page of Patient Logbook.  Place Patient Logbook in the blue and white box. Use locking tab on box and tape box closed  securely.  The blue and white box has prepaid postage on it. Please place it in the mailbox as  soon as possible. Your physician should have your test results approximately 7 days after the  monitor has been mailed back to [facility].  Call [facility] Customer Care at [phone] if you have questions regarding  your ZIO XT patch monitor. Call them immediately if you see an orange light blinking on your  monitor.  If your monitor falls off in less than 4 days, contact our Monitor department at [phone].  If your monitor becomes  loose or falls off after 4 days call [employer] at [phone] for  suggestions on securing your monitor    Follow-Up: At [facility], you and your health needs are our priority.  As part of our continuing mission to provide you with exceptional heart care, we have created designated Provider Care Teams.  These Care Teams include your primary Cardiologist (physician) and Advanced Practice Providers (APPs -  Physician Assistants and Nurse Practitioners) who all work together to provide you with the care you need, when you need it.  We recommend signing up for the patient portal called "MyChart".  Sign up information is provided on this After Visit Summary.  MyChart is used to connect with patients for Virtual Visits (Telemedicine).  Patients are able to view lab/test results, encounter notes, upcoming appointments, etc.  Non-urgent messages can be sent to your provider as well.   To learn more about what you can do with MyChart, go to [email or web address].    Your next appointment:   Follow up as needed following heart monitor results.    Provider:   Velna Ochs, MD    Other Instructions

## 2023-09-03 DIAGNOSIS — R002 Palpitations: Secondary | ICD-10-CM

## 2023-09-11 ENCOUNTER — Ambulatory Visit: Payer: Commercial Managed Care - PPO | Admitting: Cardiovascular Disease

## 2023-09-18 ENCOUNTER — Encounter: Payer: Self-pay | Admitting: Cardiovascular Disease

## 2023-09-19 DIAGNOSIS — R002 Palpitations: Secondary | ICD-10-CM | POA: Diagnosis not present

## 2023-09-23 ENCOUNTER — Other Ambulatory Visit: Payer: Self-pay

## 2023-09-23 ENCOUNTER — Other Ambulatory Visit (HOSPITAL_BASED_OUTPATIENT_CLINIC_OR_DEPARTMENT_OTHER): Payer: Self-pay

## 2023-09-23 ENCOUNTER — Other Ambulatory Visit (HOSPITAL_COMMUNITY): Payer: Self-pay

## 2023-09-23 DIAGNOSIS — F411 Generalized anxiety disorder: Secondary | ICD-10-CM | POA: Diagnosis not present

## 2023-09-23 DIAGNOSIS — R002 Palpitations: Secondary | ICD-10-CM | POA: Diagnosis not present

## 2023-09-23 DIAGNOSIS — I4729 Other ventricular tachycardia: Secondary | ICD-10-CM | POA: Diagnosis not present

## 2023-09-23 DIAGNOSIS — I471 Supraventricular tachycardia, unspecified: Secondary | ICD-10-CM

## 2023-09-23 MED ORDER — METOPROLOL SUCCINATE ER 25 MG PO TB24
25.0000 mg | ORAL_TABLET | Freq: Every day | ORAL | 3 refills | Status: DC
  Filled 2023-09-23: qty 90, 90d supply, fill #0

## 2023-09-23 MED ORDER — SERTRALINE HCL 25 MG PO TABS
25.0000 mg | ORAL_TABLET | Freq: Every day | ORAL | 5 refills | Status: AC
  Filled 2023-09-23: qty 30, 30d supply, fill #0

## 2023-10-01 ENCOUNTER — Other Ambulatory Visit (HOSPITAL_COMMUNITY): Payer: Self-pay

## 2023-10-01 ENCOUNTER — Other Ambulatory Visit (HOSPITAL_BASED_OUTPATIENT_CLINIC_OR_DEPARTMENT_OTHER): Payer: Self-pay

## 2023-10-01 ENCOUNTER — Encounter (HOSPITAL_BASED_OUTPATIENT_CLINIC_OR_DEPARTMENT_OTHER): Payer: Self-pay

## 2023-10-01 ENCOUNTER — Other Ambulatory Visit (INDEPENDENT_AMBULATORY_CARE_PROVIDER_SITE_OTHER): Payer: Self-pay | Admitting: Physician Assistant

## 2023-10-01 MED ORDER — FAMOTIDINE 20 MG PO TABS
20.0000 mg | ORAL_TABLET | Freq: Two times a day (BID) | ORAL | 11 refills | Status: DC
  Filled 2023-10-01: qty 60, 30d supply, fill #0
  Filled 2024-01-22: qty 60, 30d supply, fill #1
  Filled 2024-05-25: qty 60, 30d supply, fill #2

## 2023-10-01 NOTE — Progress Notes (Signed)
PT requesting refill of Pepcid for GERD, well controlled while taking. No issues or concerns noted. Medication sent to pharmacy.

## 2023-10-02 ENCOUNTER — Ambulatory Visit (HOSPITAL_COMMUNITY): Payer: Commercial Managed Care - PPO | Attending: Cardiovascular Disease

## 2023-10-02 ENCOUNTER — Other Ambulatory Visit (HOSPITAL_BASED_OUTPATIENT_CLINIC_OR_DEPARTMENT_OTHER): Payer: Self-pay

## 2023-10-02 DIAGNOSIS — I471 Supraventricular tachycardia, unspecified: Secondary | ICD-10-CM | POA: Diagnosis not present

## 2023-10-02 LAB — ECHOCARDIOGRAM COMPLETE
Area-P 1/2: 3.91 cm2
S' Lateral: 2.9 cm

## 2023-10-07 ENCOUNTER — Telehealth (INDEPENDENT_AMBULATORY_CARE_PROVIDER_SITE_OTHER): Payer: Self-pay | Admitting: Physician Assistant

## 2023-10-07 ENCOUNTER — Other Ambulatory Visit (HOSPITAL_COMMUNITY): Payer: Self-pay

## 2023-10-07 MED ORDER — MECLIZINE HCL 12.5 MG PO TABS
12.5000 mg | ORAL_TABLET | Freq: Three times a day (TID) | ORAL | 0 refills | Status: AC | PRN
  Filled 2023-10-07: qty 30, 10d supply, fill #0

## 2023-10-07 NOTE — Telephone Encounter (Signed)
Spoke with patient today, having an acute vertigo attack, Similar to previous although she hasn't had one in many years. Room spinning, no chest pain, neuro deficits. Worse with head movements, better with no movement, Has taken meclizine in the past.   Plan to send in meclizine now 12.5 PRN Q6 to help her get over acute episode. Will recheck tomorrow.

## 2023-10-08 ENCOUNTER — Other Ambulatory Visit (HOSPITAL_COMMUNITY): Payer: Self-pay

## 2023-10-08 ENCOUNTER — Ambulatory Visit (INDEPENDENT_AMBULATORY_CARE_PROVIDER_SITE_OTHER): Payer: Commercial Managed Care - PPO | Admitting: Physician Assistant

## 2023-10-08 DIAGNOSIS — R42 Dizziness and giddiness: Secondary | ICD-10-CM | POA: Diagnosis not present

## 2023-10-08 MED ORDER — DIAZEPAM 5 MG PO TABS
5.0000 mg | ORAL_TABLET | Freq: Three times a day (TID) | ORAL | 0 refills | Status: DC | PRN
  Filled 2023-10-08: qty 9, 3d supply, fill #0

## 2023-10-08 NOTE — Progress Notes (Signed)
Dear Dr. Docia Chuck, Here is my assessment for our mutual patient, Laura Wall. Thank you for allowing me the opportunity to care for your patient. Please do not hesitate to contact me should you have any other questions. Sincerely, Burna Forts PA-C  Otolaryngology Clinic Note Referring provider: Dr. Docia Chuck HPI:  Laura Wall is a 62 y.o. female kindly referred by Dr. Docia Chuck for evaluation of vertigo.   The patient notes a remote history of an episode of vertigo this was many years ago.  She notes that approximately 48 hours ago at 3:30 AM she had a acute onset a room spinning sensation with nausea.  She notes she did have such significant difficulty with the vertigo that she fell down.  She notes this has been consistent since that time, she has had some waxing and waning.  She denies any severe headache, any chest pain shortness of breath, no neurologic deficits.  She reports his symptoms are improved with laying down and not moving, any neck movement causes worsening of the symptoms.  She notes this is identical to her previous episodes of vertigo.  She notes she has taken meclizine and diazepam previously.  She notes that the meclizine made her extremely sleepy and out of it, she notes the diazepam significantly symptoms.   Independent Review of Additional Tests or Records:  none   PMH/Meds/All/SocHx/FamHx/ROS:   Past Medical History:  Diagnosis Date   Cancer (HCC)    basal and squamous   Complication of anesthesia    woke up during some surgeries   Frequency of urination    History of kidney stones    Iron deficiency anemia    Seasonal allergies    SUI (stress urinary incontinence, female)    Toe fracture, left    3rd toe   Ureteral stone pt states stone embedded in urethral sling   Urgency of urination      Past Surgical History:  Procedure Laterality Date   ABDOMINAL HYSTERECTOMY     BENIGN LEFT BREAST LUMPECTOMY  1985   BREAST LUMPECTOMY WITH RADIOACTIVE SEED  LOCALIZATION Right 10/07/2014   Procedure: RIGHT BREAST LUMPECTOMY WITH RADIOACTIVE SEED LOCALIZATION;  Surgeon: Emelia Loron, MD;  Location: [facility];  Service: General;  Laterality: Right;   BREAST RECONSTRUCTION Bilateral 09/2014   BREAST REDUCTION SURGERY Bilateral 10/07/2014   Procedure: BILATERAL MAMMARY REDUCTION  (BREAST);  Surgeon: Wayland Denis, DO;  Location: [facility];  Service: Plastics;  Laterality: Bilateral;   COLD KNIFE CERVICAL CONIZATION  1982   COLONOSCOPY     COLONOSCOPY WITH PROPOFOL  01/15/2022   CYSTOSCOPY  08/29/2012   Procedure: CYSTOSCOPY;  Surgeon: Sebastian Ache, MD;  Location: [facility];  Service: Urology;  Laterality: N/A;   ECTOPIC PREGNANCY SURGERY  2003   BILATERAL FIMBRIECTOMIES (TUBAL RUPTURE)   EXCISION MELANOMA LESION RIGHT TOE  2009   HOLMIUM LASER APPLICATION  08/29/2012   Procedure: HOLMIUM LASER APPLICATION;  Surgeon: Sebastian Ache, MD;  Location: [facility];  Service: Urology;  Laterality: N/A;  There is no left or right designated because the stone is embedded in the urethral sling   HYSTEROSCOPY W/  NOVASURE ENDOMETRIAL ABLATION  2008   AND URETHRAL SLING PLACEMENT   LIPOSUCTION Bilateral 10/07/2014   Procedure: LIPOSUCTION;  Surgeon: Wayland Denis, DO;  Location:  SURGERY CENTER;  Service: Plastics;  Laterality: Bilateral;   NEUROMA SURGERY  2022   ORIF TOE FRACTURE Left 01/12/2016   Procedure: OPEN REDUCTION INTERNAL  FIXATION (ORIF) LEFT THIRD TOE FRACTURE;  Surgeon: Toni Arthurs, MD;  Location: [facility];  Service: Orthopedics;  Laterality: Left;   ROBOTIC-ASSISTED TOTAL HYSTERECOTMY W/ LEFT OVARIAN CYSTECTOMY  01-14-2009  DR RIVARD   CHRONIC PELVIC PAIN/ UTERINE FIBROIDS/ ENDOMETRIOSIS/ ANEMIA   SACROPLASTY  2021   TUBAL LIGATION  2003   BILATERAL    Family History  Problem Relation Age of Onset   Cancer Mother        multiple myeloma    Cancer Father 14       lung   Breast cancer Paternal Aunt    Breast cancer Paternal Aunt    Breast cancer Paternal Aunt    Colon cancer Neg Hx    Colon polyps Neg Hx    Esophageal cancer Neg Hx    Rectal cancer Neg Hx    Stomach cancer Neg Hx      Social Connections: Not on file      Current Outpatient Medications:    amphetamine-dextroamphetamine (ADDERALL XR) 20 MG 24 hr capsule, Take 1 capsule (20 mg total) by mouth in the morning. (Patient not taking: Reported on 08/30/2023), Disp: 30 capsule, Rfl: 0   cholecalciferol (VITAMIN D3) 25 MCG (1000 UNIT) tablet, Take 1,000 Units by mouth daily., Disp: , Rfl:    clindamycin (CLEOCIN) 150 MG capsule, Take 2 capsules (300 mg total) by mouth now AND 1 capsule (150 mg total) 3 (three) times daily for dental infection (Patient not taking: Reported on 08/30/2023), Disp: 21 capsule, Rfl: 1   famotidine (PEPCID) 20 MG tablet, Take 1 tablet (20 mg total) by mouth 2 (two) times daily., Disp: 60 tablet, Rfl: 11   FeFum-FePoly-FA-B Cmp-C-Biot (FOLIVANE-PLUS) CAPS, Take 1 capsule every day by oral route. (Patient not taking: Reported on 08/30/2023), Disp: 90 capsule, Rfl: 3   meclizine (ANTIVERT) 12.5 MG tablet, Take 1 tablet (12.5 mg total) by mouth 3 (three) times daily as needed for dizziness., Disp: 30 tablet, Rfl: 0   meloxicam (MOBIC) 15 MG tablet, Take 1 tablet (15 mg total) by mouth every other day., Disp: 30 tablet, Rfl: 2   metoprolol succinate (TOPROL XL) 25 MG 24 hr tablet, Take 1 tablet (25 mg total) by mouth daily., Disp: 90 tablet, Rfl: 3   Semaglutide-Weight Management (WEGOVY) 1.7 MG/0.75ML SOAJ, Inject 1.7 mg into the skin once a week., Disp: 3 mL, Rfl: 1   sertraline (ZOLOFT) 25 MG tablet, Take 1 tablet (25 mg total) by mouth daily., Disp: 30 tablet, Rfl: 5   Physical Exam:   There were no vitals taken for this visit.  Pertinent Findings  CN II-XII intact, no nystagmus   Bilateral EAC clear and TM intact with well pneumatized middle  ear spaces Anterior rhinoscopy: Septum midline; bilateral inferior turbinates with no hypertrophy No lesions of oral cavity/oropharynx; dentition WNL No obviously palpable neck masses/lymphadenopathy/thyromegaly No respiratory distress or stridor  Seprately Identifiable Procedures:  None  Impression & Plans:  Laura Wall is a 62 y.o. female with vertigo  Vertigo-  This is a 62 year old female presenting with vertiginous symptoms.  This is most consistent with an acute episode of vertigo.  She has had identical symptoms previously that were improved with diazepam.  I originally prescribed her meclizine but she was hesitant to take this as it caused significant sleepiness and inability to function.  I did review the PDMP, given that she has had significant improvement in her symptoms previously with diazepam I do feel it is reasonable to give  her a short course of this medication.  I have low suspicion for any other acute etiology at this time, she has no cardiac symptoms, no acute neurologic symptoms that would indicate any other etiology.  The patient is happy with today's plan, she will attempt the diazepam and let me know if her symptoms persist or do not improve.  She was given strict return precautions.  She verbalized understanding and agreement to this plan had no further questions or concerns.    - f/u PRN   Thank you for allowing me the opportunity to care for your patient. Please do not hesitate to contact me should you have any other questions.  Sincerely, Burna Forts PA-C [facility] Phone: [phone] Fax: [phone]  10/08/2023, 11:09 AM

## 2023-10-11 ENCOUNTER — Ambulatory Visit (INDEPENDENT_AMBULATORY_CARE_PROVIDER_SITE_OTHER): Payer: Self-pay | Admitting: Plastic Surgery

## 2023-10-11 DIAGNOSIS — Z719 Counseling, unspecified: Secondary | ICD-10-CM

## 2023-10-11 NOTE — Progress Notes (Signed)

## 2023-10-16 DIAGNOSIS — F411 Generalized anxiety disorder: Secondary | ICD-10-CM | POA: Diagnosis not present

## 2023-10-16 DIAGNOSIS — H8110 Benign paroxysmal vertigo, unspecified ear: Secondary | ICD-10-CM | POA: Diagnosis not present

## 2023-10-18 ENCOUNTER — Other Ambulatory Visit (HOSPITAL_BASED_OUTPATIENT_CLINIC_OR_DEPARTMENT_OTHER): Payer: Self-pay

## 2023-10-18 ENCOUNTER — Other Ambulatory Visit (HOSPITAL_COMMUNITY): Payer: Commercial Managed Care - PPO

## 2023-10-22 ENCOUNTER — Other Ambulatory Visit (HOSPITAL_COMMUNITY): Payer: Self-pay

## 2023-10-22 MED ORDER — DIAZEPAM 10 MG PO TABS
10.0000 mg | ORAL_TABLET | ORAL | 0 refills | Status: DC | PRN
  Filled 2023-10-22: qty 30, 90d supply, fill #0

## 2023-10-31 ENCOUNTER — Other Ambulatory Visit: Payer: Self-pay | Admitting: Plastic Surgery

## 2023-11-04 ENCOUNTER — Ambulatory Visit: Payer: Commercial Managed Care - PPO | Attending: Nurse Practitioner | Admitting: Nurse Practitioner

## 2023-11-04 ENCOUNTER — Encounter: Payer: Self-pay | Admitting: Nurse Practitioner

## 2023-11-04 ENCOUNTER — Other Ambulatory Visit (HOSPITAL_COMMUNITY): Payer: Self-pay

## 2023-11-04 VITALS — BP 128/74 | HR 79 | Ht 70.0 in | Wt 180.6 lb

## 2023-11-04 DIAGNOSIS — F419 Anxiety disorder, unspecified: Secondary | ICD-10-CM

## 2023-11-04 DIAGNOSIS — E782 Mixed hyperlipidemia: Secondary | ICD-10-CM | POA: Diagnosis not present

## 2023-11-04 DIAGNOSIS — I471 Supraventricular tachycardia, unspecified: Secondary | ICD-10-CM | POA: Diagnosis not present

## 2023-11-04 DIAGNOSIS — R002 Palpitations: Secondary | ICD-10-CM

## 2023-11-04 MED ORDER — METOPROLOL TARTRATE 25 MG PO TABS
25.0000 mg | ORAL_TABLET | Freq: Two times a day (BID) | ORAL | 3 refills | Status: AC | PRN
  Filled 2023-11-04 (×2): qty 180, 90d supply, fill #0

## 2023-11-04 NOTE — Patient Instructions (Signed)
 Medication Instructions:  Stop Metoprolol  Succinate as directed. Start Metoprolol  Tartrate 25 mg take one tablet twice daily as needed only for palpitations or heart rate greater than 120 bpm.   *If you need a refill on your cardiac medications before your next appointment, please call your pharmacy*   Lab Work: NONE ordered at this time of appointment    Testing/Procedures: NONE ordered at this time of appointment    Follow-Up: At [facility], you and your health needs are our priority.  As part of our continuing mission to provide you with exceptional heart care, we have created designated Provider Care Teams.  These Care Teams include your primary Cardiologist (physician) and Advanced Practice Providers (APPs -  Physician Assistants and Nurse Practitioners) who all work together to provide you with the care you need, when you need it.  We recommend signing up for the patient portal called MyChart.  Sign up information is provided on this After Visit Summary.  MyChart is used to connect with patients for Virtual Visits (Telemedicine).  Patients are able to view lab/test results, encounter notes, upcoming appointments, etc.  Non-urgent messages can be sent to your provider as well.   To learn more about what you can do with MyChart, go to [email or web address].    Your next appointment:   3 month(s)  Provider:   Darryle ONEIDA Decent, MD  or Damien Braver, NP

## 2023-11-04 NOTE — Progress Notes (Signed)
 Office Visit    Patient Name: Laura Wall Date of Encounter: 11/04/2023  Primary Care Provider:  Regino Slater, MD Primary Cardiologist:  Darryle ONEIDA Decent, MD  Chief Complaint    63 year old female with a history of palpitations, SVT, and hyperlipidemia who presents for follow-up related to SVT/palpitations.  Past Medical History    Past Medical History:  Diagnosis Date   Cancer (HCC)    basal and squamous   Complication of anesthesia    woke up during some surgeries   Frequency of urination    History of kidney stones    Iron  deficiency anemia    Seasonal allergies    SUI (stress urinary incontinence, female)    Toe fracture, left    3rd toe   Ureteral stone pt states stone embedded in urethral sling   Urgency of urination    Past Surgical History:  Procedure Laterality Date   ABDOMINAL HYSTERECTOMY     BENIGN LEFT BREAST LUMPECTOMY  1985   BREAST LUMPECTOMY WITH RADIOACTIVE SEED LOCALIZATION Right 10/07/2014   Procedure: RIGHT BREAST LUMPECTOMY WITH RADIOACTIVE SEED LOCALIZATION;  Surgeon: Donnice Bury, MD;  Location: [facility];  Service: General;  Laterality: Right;   BREAST RECONSTRUCTION Bilateral 09/2014   BREAST REDUCTION SURGERY Bilateral 10/07/2014   Procedure: BILATERAL MAMMARY REDUCTION  (BREAST);  Surgeon: Estefana Reichert, DO;  Location: [facility];  Service: Plastics;  Laterality: Bilateral;   COLD KNIFE CERVICAL CONIZATION  1982   COLONOSCOPY     COLONOSCOPY WITH PROPOFOL   01/15/2022   CYSTOSCOPY  08/29/2012   Procedure: CYSTOSCOPY;  Surgeon: Ricardo Likens, MD;  Location: [facility];  Service: Urology;  Laterality: N/A;   ECTOPIC PREGNANCY SURGERY  2003   BILATERAL FIMBRIECTOMIES (TUBAL RUPTURE)   EXCISION MELANOMA LESION RIGHT TOE  2009   HOLMIUM LASER APPLICATION  08/29/2012   Procedure: HOLMIUM LASER APPLICATION;  Surgeon: Ricardo Likens, MD;  Location: [facility];  Service:  Urology;  Laterality: N/A;  There is no left or right designated because the stone is embedded in the urethral sling   HYSTEROSCOPY W/  NOVASURE ENDOMETRIAL ABLATION  2008   AND URETHRAL SLING PLACEMENT   LIPOSUCTION Bilateral 10/07/2014   Procedure: LIPOSUCTION;  Surgeon: Estefana Reichert, DO;  Location: [facility];  Service: Plastics;  Laterality: Bilateral;   NEUROMA SURGERY  2022   ORIF TOE FRACTURE Left 01/12/2016   Procedure: OPEN REDUCTION INTERNAL FIXATION (ORIF) LEFT THIRD TOE FRACTURE;  Surgeon: Norleen Armor, MD;  Location:  SURGERY CENTER;  Service: Orthopedics;  Laterality: Left;   ROBOTIC-ASSISTED TOTAL HYSTERECOTMY W/ LEFT OVARIAN CYSTECTOMY  01-14-2009  DR RIVARD   CHRONIC PELVIC PAIN/ UTERINE FIBROIDS/ ENDOMETRIOSIS/ ANEMIA   SACROPLASTY  2021   TUBAL LIGATION  2003   BILATERAL    Allergies  Allergies  Allergen Reactions   Codeine Nausea And Vomiting   Latex Rash   Penicillins Rash   Sulfa  Antibiotics Rash    severe     Labs/Other Studies Reviewed    The following studies were reviewed today:  Cardiac Studies & Procedures      ECHOCARDIOGRAM  ECHOCARDIOGRAM COMPLETE 10/02/2023  Narrative ECHOCARDIOGRAM REPORT    Patient Name:   Laura Wall Date of Exam: 10/02/2023 Medical Rec #:  [id number]      Height:       70.0 in Accession #:    [id number]     Weight:  179.2 lb Date of Birth:  1960-12-08       BSA:          1.992 m Patient Age:    62 years       BP:           122/80 mmHg Patient Gender: F              HR:           69 bpm. Exam Location:  [address]  Procedure: 2D Echo, Cardiac Doppler, Color Doppler, 3D Echo and Strain Analysis  Indications:    I47.1 SVT  History:        Patient has no prior history of Echocardiogram examinations. Risk Factors:Dyslipidemia. Palpitations.  Sonographer:    Jon Hacker RCS Referring Phys: DARRYLE NED O'NEAL  IMPRESSIONS   1. Left ventricular ejection fraction, by  estimation, is 60 to 65%. The left ventricle has normal function. The left ventricle has no regional wall motion abnormalities. Left ventricular diastolic parameters were normal. 2. Right ventricular systolic function is normal. The right ventricular size is normal. 3. The mitral valve is grossly normal. No evidence of mitral valve regurgitation. 4. The aortic valve was not well visualized. Aortic valve regurgitation is not visualized. 5. The inferior vena cava is normal in size with greater than 50% respiratory variability, suggesting right atrial pressure of 3 mmHg.  Comparison(s): No prior Echocardiogram.  Conclusion(s)/Recommendation(s): Normal biventricular function without evidence of hemodynamically significant valvular heart disease.  FINDINGS Left Ventricle: Left ventricular ejection fraction, by estimation, is 60 to 65%. The left ventricle has normal function. The left ventricle has no regional wall motion abnormalities. The left ventricular internal cavity size was normal in size. There is no left ventricular hypertrophy. Left ventricular diastolic parameters were normal.  Right Ventricle: The right ventricular size is normal. Right ventricular systolic function is normal.  Left Atrium: Left atrial size was normal in size.  Right Atrium: Right atrial size was normal in size.  Pericardium: There is no evidence of pericardial effusion.  Mitral Valve: The mitral valve is grossly normal. No evidence of mitral valve regurgitation.  Tricuspid Valve: Tricuspid valve regurgitation is not demonstrated.  Aortic Valve: The aortic valve was not well visualized. Aortic valve regurgitation is not visualized.  Pulmonic Valve: Pulmonic valve regurgitation is not visualized.  Aorta: The aortic root and ascending aorta are structurally normal, with no evidence of dilitation.  Venous: The inferior vena cava is normal in size with greater than 50% respiratory variability, suggesting right  atrial pressure of 3 mmHg.  IAS/Shunts: The interatrial septum was not well visualized.   LEFT VENTRICLE PLAX 2D LVIDd:         4.10 cm   Diastology LVIDs:         2.90 cm   LV e' medial:    11.60 cm/s LV PW:         0.90 cm   LV E/e' medial:  7.9 LV IVS:        1.00 cm   LV e' lateral:   10.60 cm/s LVOT diam:     2.00 cm   LV E/e' lateral: 8.7 LV SV:         78 LV SV Index:   39 LVOT Area:     3.14 cm  3D Volume EF: 3D EF:        68 % LV EDV:       153 ml LV ESV:  48 ml LV SV:        105 ml  RIGHT VENTRICLE RV S prime:     10.90 cm/s  LEFT ATRIUM             Index LA diam:        3.50 cm 1.76 cm/m LA Vol (A2C):   52.7 ml 26.45 ml/m LA Vol (A4C):   43.7 ml 21.94 ml/m LA Biplane Vol: 48.2 ml 24.20 ml/m AORTIC VALVE LVOT Vmax:   125.00 cm/s LVOT Vmean:  73.600 cm/s LVOT VTI:    0.248 m  AORTA Ao Root diam: 2.70 cm Ao Asc diam:  3.30 cm  MITRAL VALVE MV Area (PHT): 3.91 cm    SHUNTS MV Decel Time: 194 msec    Systemic VTI:  0.25 m MV E velocity: 92.20 cm/s  Systemic Diam: 2.00 cm MV A velocity: 61.30 cm/s MV E/A ratio:  1.50  Photographer signed by Ronal Ross Signature Date/Time: 10/02/2023/3:00:32 PM    Final   MONITORS  LONG TERM MONITOR (3-14 DAYS) 09/19/2023  Narrative Patch Wear Time:  7 days and 0 hours (2024-11-05T03:57:29-0500 to 2024-11-12T04:08:28-0500)  Patient had a min HR of 56 bpm (sinus bradycardia), max HR of 193 bpm (supraventricular tachycardia) and avg HR of 77 bpm (normal sinus rhythm). Predominant underlying rhythm was Sinus Rhythm. 15 Supraventricular Tachycardia runs occurred, the run with the fastest interval lasting 5 beats (2 second duration) with a max rate of 193 bpm, the longest lasting 18 beats (8.3 second duration) with an avg rate of 134 bpm. Isolated SVEs were rare (<1.0%), SVE Couplets were rare (<1.0%), and SVE Triplets were rare (<1.0%). Isolated VEs were rare (<1.0%), VE Couplets were rare (<1.0%),  and no VE Triplets were present.  Impression: Brief SVT present (15 episodes in 7 days; longest duration 8.3 seconds). Rare ectopy.  Darryle T. Barbaraann, MD, [facility]  [facility] [address], [address] [place], [place] [address] [phone] 8:44 PM  CT SCANS  CT CARDIAC SCORING (SELF PAY ONLY) 03/05/2022  Addendum 03/06/2022  5:59 AM ADDENDUM REPORT: 03/06/2022 05:56  CLINICAL DATA:  69F for cardiovascular disease risk stratification  EXAM: Coronary Calcium Score  TECHNIQUE: A gated, non-contrast computed tomography scan of the heart was performed using 3mm slice thickness. Axial images were analyzed on a dedicated workstation. Calcium scoring of the coronary arteries was performed using the Agatston method.  FINDINGS: Coronary arteries: Normal origins.  Coronary Calcium Score: 0  Pericardium: Normal.  Ascending Aorta: Normal caliber.  2.9 cm.  Aortic atherosclerosis.  Non-cardiac: See separate report from [facility].  IMPRESSION: Coronary calcium score of 0. This was 0 percentile for age-, race-, and sex-matched controls.  Mild aortic atherosclerosis.  RECOMMENDATIONS: Coronary artery calcium (CAC) score is a strong predictor of incident coronary heart disease (CHD) and provides predictive information beyond traditional risk factors. CAC scoring is reasonable to use in the decision to withhold, postpone, or initiate statin therapy in intermediate-risk or selected borderline-risk asymptomatic adults (age 26-75 years and LDL-C >=70 to <190 mg/dL) who do not have diabetes or established atherosclerotic cardiovascular disease (ASCVD).* In intermediate-risk (10-year ASCVD risk >=7.5% to <20%) adults or selected borderline-risk (10-year ASCVD risk >=5% to <7.5%) adults in whom a CAC score is measured for the purpose of making a treatment decision the following recommendations have been made:  If CAC=0, it is reasonable to withhold statin  therapy and reassess in 5 to 10 years, as long as higher risk conditions  are absent (diabetes mellitus, family history of premature CHD in first degree relatives (males <55 years; females <65 years), cigarette smoking, or LDL >=190 mg/dL).  If CAC is 1 to 99, it is reasonable to initiate statin therapy for patients >=25 years of age.  If CAC is >=100 or >=75th percentile, it is reasonable to initiate statin therapy at any age.  Cardiology referral should be considered for patients with CAC scores >=400 or >=75th percentile.  *2018 AHA/ACC/AACVPR/AAPA/ABC/ACPM/ADA/AGS/APhA/ASPC/NLA/PCNA Guideline on the Management of Blood Cholesterol: A Report of the American College of Cardiology/American Heart Association Task Force on Clinical Practice Guidelines. J Am Coll Cardiol. 2019;73(24):3168-3209.  Annabella Scarce, MD   Electronically Signed By: Annabella Scarce M.D. On: 03/06/2022 05:56  Narrative CLINICAL DATA:  This over-read does not include interpretation of cardiac or coronary anatomy or pathology. The coronary calcium score interpretation by the cardiologist is attached.  COMPARISON:  None Available.  FINDINGS: Vascular: High takeoff of the RCA. No significant extracardiac vascular findings.  Mediastinum/Nodes: No lymphadenopathy.  Lungs/Pleura: There is a 5 mm part solid nodule in the left lateral lower lobe (series 2, image 28). Elevated left hemidiaphragm.  Upper Abdomen: No acute abnormality. Diverticulosis of the splenic flexure.  Musculoskeletal: No acute osseous abnormality. No suspicious lytic or blastic lesions.  IMPRESSION: 5 mm part solid nodule in the left lower lobe. No follow-up recommended. This recommendation follows the consensus statement: Guidelines for Management of Incidental Pulmonary Nodules Detected on CT Images: From the Fleischner Society 2017; Radiology 2017; 284:228-243.  Interpretation of cardiac/coronary anatomy by cardiology  to follow.  Electronically Signed: By: Lang Sprinkles M.D. On: 03/05/2022 11:24         Recent Labs: 08/26/2023: ALT 32; BUN 11; Creatinine, Ser 0.81; Hemoglobin 15.1; Platelets 332; Potassium 4.3; Sodium 139  Recent Lipid Panel No results found for: CHOL, TRIG, HDL, CHOLHDL, VLDL, LDLCALC, LDLDIRECT  History of Present Illness    63 year old female with the above past medical history including palpitations, SVT, and hyperlipidemia.  Coronary calcium score in 2023 was 0.  She was evaluated in the ED in October 2024 in the setting of elevated heart rate, elevated blood pressure, numbness in her legs.  ER workup was unremarkable.  She was referred to cardiology.  She was last seen in the office on 08/30/2023 and noted palpitations associated with high stress levels.  EKG was unremarkable.  7-day ZIO monitor revealed brief episodes of PSVT (15 episodes in 7 days, longest duration 8.3 seconds), rare ectopy.  Echocardiogram was normal.  She was started on metoprolol .  She presents today for follow-up.  Since her last visit she has been stable overall from a cardiac standpoint.  She continues to note intermittent palpitations in the setting of stress and anxiety.  She took her metoprolol  succinate for approximately 5 days, however, this coincided with symptoms of vertigo.  She was prescribed diazepam  and meclizine  by her PCP, she stopped taking metoprolol  and her symptoms improved.  She does have a history of vertigo approximately 30 years ago.  She did not resume metoprolol .  Additionally, she was prescribed sertraline  by her PCP, however, she has not yet started this medication. She is also interested in resuming GLP-1 receptor agonist and plans to discuss this with her PCP/[facility].  She is hesitant to resume daily beta-blocker therapy at this time.  Other than her ongoing palpitations, she denies any additional concerns today.   Home Medications    Current  Outpatient Medications  Medication Sig Dispense Refill   famotidine  (PEPCID ) 20 MG tablet Take 1 tablet (20 mg total) by mouth 2 (two) times daily. 60 tablet 11   FeFum-FePoly-FA-B Cmp-C-Biot (FOLIVANE-PLUS) CAPS Take 1 capsule every day by oral route. 90 capsule 3   meloxicam  (MOBIC ) 15 MG tablet Take 1 tablet (15 mg total) by mouth every other day. 30 tablet 2   metoprolol  tartrate (LOPRESSOR ) 25 MG tablet Take 1 tablet (25 mg total) by mouth 2 (two) times daily as needed only for palpitations or heart rate grater than 120 bpm 180 tablet 3   sertraline  (ZOLOFT ) 25 MG tablet Take 1 tablet (25 mg total) by mouth daily. 30 tablet 5   amphetamine -dextroamphetamine  (ADDERALL  XR) 20 MG 24 hr capsule Take 1 capsule (20 mg total) by mouth in the morning. (Patient not taking: Reported on 08/30/2023) 30 capsule 0   cholecalciferol (VITAMIN D3) 25 MCG (1000 UNIT) tablet Take 1,000 Units by mouth daily.     clindamycin  (CLEOCIN ) 150 MG capsule Take 2 capsules (300 mg total) by mouth now AND 1 capsule (150 mg total) 3 (three) times daily for dental infection (Patient not taking: Reported on 08/30/2023) 21 capsule 1   diazepam  (VALIUM ) 10 MG tablet Take 1 tablet (10 mg total) by mouth as needed for vertigo. (90 day supply) (Patient not taking: Reported on 11/04/2023) 30 tablet 0   diazepam  (VALIUM ) 5 MG tablet Take 1 tablet (5 mg total) by mouth every 8 (eight) hours as needed for anxiety. 9 tablet 0   meclizine  (ANTIVERT ) 12.5 MG tablet Take 1 tablet (12.5 mg total) by mouth 3 (three) times daily as needed for dizziness. (Patient not taking: Reported on 11/04/2023) 30 tablet 0   Semaglutide -Weight Management (WEGOVY ) 1.7 MG/0.75ML SOAJ Inject 1.7 mg into the skin once a week. 3 mL 1   valACYclovir  (VALTREX ) 1000 MG tablet Take 1 g by mouth as needed. (Patient not taking: Reported on 11/04/2023)     No current facility-administered medications for this visit.     Review of Systems    She denies chest pain, dyspnea,  pnd, orthopnea, n, v, dizziness, syncope, edema, weight gain, or early satiety. All other systems reviewed and are otherwise negative except as noted above.   Physical Exam    VS:  BP 128/74   Pulse 79   Ht 5' 10 (1.778 m)   Wt 180 lb 9.6 oz (81.9 kg)   SpO2 97%   BMI 25.91 kg/m  GEN: Well nourished, well developed, in no acute distress. HEENT: normal. Neck: Supple, no JVD, carotid bruits, or masses. Cardiac: RRR, no murmurs, rubs, or gallops. No clubbing, cyanosis, edema.  Radials/DP/PT 2+ and equal bilaterally.  Respiratory:  Respirations regular and unlabored, clear to auscultation bilaterally. GI: Soft, nontender, nondistended, BS + x 4. MS: no deformity or atrophy. Skin: warm and dry, no rash. Neuro:  Strength and sensation are intact. Psych: Normal affect.  Accessory Clinical Findings    ECG personally reviewed by me today -    - no EKG in office today.    Lab Results  Component Value Date   WBC 6.5 08/26/2023   HGB 15.1 (H) 08/26/2023   HCT 44.7 08/26/2023   MCV 86.5 08/26/2023   PLT 332 08/26/2023   Lab Results  Component Value Date   CREATININE 0.81 08/26/2023   BUN 11 08/26/2023   NA 139 08/26/2023   K 4.3 08/26/2023   CL 106 08/26/2023   CO2 26 08/26/2023   Lab  Results  Component Value Date   ALT 32 08/26/2023   AST 22 08/26/2023   ALKPHOS 85 08/26/2023   BILITOT 0.3 08/26/2023   No results found for: CHOL, HDL, LDLCALC, LDLDIRECT, TRIG, CHOLHDL  No results found for: HGBA1C  Assessment & Plan    1. Palpitations/PSVT:  7-day ZIO monitor in 08/2023 revealed brief episodes of PSVT (15 episodes in 7 days, longest duration 8.3 seconds), rare ectopy.   Echocardiogram in 09/2023 was normal. Continue metoprolol .  She started taking metoprolol  succinate, however, this coincided with an episode of vertigo.  She stopped taking metoprolol  and has not resumed the medication.  Through shared decision making, will stop metoprolol  succinate. Will  provide prescription for metoprolol  tartrate 25 mg bid as needed for palpitations/HR greater than 120 bpm.  She plans to start taking sertraline  and hopes that this will also help her symptoms, though we discussed today that she may ultimately require daily beta-blocker therapy.  Continue to monitor HR/symptoms.  2. Hyperlipidemia: LDL 193, however, coronary calcium score of 0.  Statin therapy was previously recommended, however, patient would prefer to work on lifestyle modifications with diet and exercise.  Additionally, she plans to discuss possible resumption of GLP-1 receptor agonist with her PCP/[facility].  3. Stress management/anxiety: She was recently prescribed sertraline  per her PCP but has not started taking the medication.  Encouraged initiation of sertraline  as prescribed for situational stress/anxiety, she is agreeable.  4. Disposition: Follow-up in 3 months, sooner if needed.       Damien JAYSON Braver, NP 11/04/2023, 12:11 PM

## 2023-11-07 DIAGNOSIS — E7800 Pure hypercholesterolemia, unspecified: Secondary | ICD-10-CM | POA: Diagnosis not present

## 2023-11-07 DIAGNOSIS — E559 Vitamin D deficiency, unspecified: Secondary | ICD-10-CM | POA: Diagnosis not present

## 2023-11-07 DIAGNOSIS — E663 Overweight: Secondary | ICD-10-CM | POA: Diagnosis not present

## 2023-11-07 DIAGNOSIS — R7303 Prediabetes: Secondary | ICD-10-CM | POA: Diagnosis not present

## 2023-11-07 DIAGNOSIS — Z6827 Body mass index (BMI) 27.0-27.9, adult: Secondary | ICD-10-CM | POA: Diagnosis not present

## 2023-11-12 ENCOUNTER — Other Ambulatory Visit (INDEPENDENT_AMBULATORY_CARE_PROVIDER_SITE_OTHER): Payer: Commercial Managed Care - PPO | Admitting: Plastic Surgery

## 2023-11-12 ENCOUNTER — Other Ambulatory Visit (HOSPITAL_COMMUNITY): Payer: Self-pay

## 2023-11-12 MED ORDER — LIDOCAINE 23% - TETRACAINE 7% TOPICAL OINTMENT (PLASTICIZED)
1.0000 | TOPICAL_OINTMENT | Freq: Once | CUTANEOUS | 0 refills | Status: AC
  Filled 2023-11-12: qty 60, 1d supply, fill #0

## 2023-11-12 NOTE — Progress Notes (Signed)
 Topical ointment

## 2023-11-13 ENCOUNTER — Other Ambulatory Visit (HOSPITAL_COMMUNITY): Payer: Self-pay

## 2023-11-15 ENCOUNTER — Other Ambulatory Visit (HOSPITAL_COMMUNITY): Payer: Self-pay

## 2023-11-22 DIAGNOSIS — Z719 Counseling, unspecified: Secondary | ICD-10-CM

## 2023-11-27 DIAGNOSIS — D485 Neoplasm of uncertain behavior of skin: Secondary | ICD-10-CM | POA: Diagnosis not present

## 2023-11-27 DIAGNOSIS — Z85828 Personal history of other malignant neoplasm of skin: Secondary | ICD-10-CM | POA: Diagnosis not present

## 2023-11-27 DIAGNOSIS — L570 Actinic keratosis: Secondary | ICD-10-CM | POA: Diagnosis not present

## 2023-11-27 DIAGNOSIS — D225 Melanocytic nevi of trunk: Secondary | ICD-10-CM | POA: Diagnosis not present

## 2023-11-27 DIAGNOSIS — D224 Melanocytic nevi of scalp and neck: Secondary | ICD-10-CM | POA: Diagnosis not present

## 2023-11-27 DIAGNOSIS — L821 Other seborrheic keratosis: Secondary | ICD-10-CM | POA: Diagnosis not present

## 2023-11-27 DIAGNOSIS — L578 Other skin changes due to chronic exposure to nonionizing radiation: Secondary | ICD-10-CM | POA: Diagnosis not present

## 2023-11-27 DIAGNOSIS — Z86018 Personal history of other benign neoplasm: Secondary | ICD-10-CM | POA: Diagnosis not present

## 2023-11-27 DIAGNOSIS — D2272 Melanocytic nevi of left lower limb, including hip: Secondary | ICD-10-CM | POA: Diagnosis not present

## 2023-11-27 DIAGNOSIS — D2230 Melanocytic nevi of unspecified part of face: Secondary | ICD-10-CM | POA: Diagnosis not present

## 2023-11-28 ENCOUNTER — Ambulatory Visit (INDEPENDENT_AMBULATORY_CARE_PROVIDER_SITE_OTHER): Payer: Self-pay | Admitting: Plastic Surgery

## 2023-11-28 DIAGNOSIS — Z719 Counseling, unspecified: Secondary | ICD-10-CM

## 2023-11-28 DIAGNOSIS — E7800 Pure hypercholesterolemia, unspecified: Secondary | ICD-10-CM | POA: Diagnosis not present

## 2023-11-29 ENCOUNTER — Encounter: Payer: Self-pay | Admitting: Plastic Surgery

## 2023-11-29 NOTE — Progress Notes (Signed)
HALO Treatment   Treatment  Settings:       In Media       Topical and/or Block: Cream applied 30 min before treatment. Pronox used.  Post Care: In chart

## 2023-12-03 ENCOUNTER — Other Ambulatory Visit (HOSPITAL_COMMUNITY): Payer: Self-pay

## 2023-12-03 MED ORDER — MELOXICAM 15 MG PO TABS
15.0000 mg | ORAL_TABLET | ORAL | 2 refills | Status: DC
  Filled 2023-12-03: qty 30, 60d supply, fill #0
  Filled 2024-02-18: qty 30, 60d supply, fill #1
  Filled 2024-04-29: qty 30, 60d supply, fill #2

## 2023-12-11 ENCOUNTER — Other Ambulatory Visit (HOSPITAL_COMMUNITY): Payer: Self-pay

## 2023-12-11 DIAGNOSIS — H5203 Hypermetropia, bilateral: Secondary | ICD-10-CM | POA: Diagnosis not present

## 2023-12-11 DIAGNOSIS — H52223 Regular astigmatism, bilateral: Secondary | ICD-10-CM | POA: Diagnosis not present

## 2023-12-11 MED ORDER — FML FORTE 0.25 % OP SUSP
1.0000 [drp] | Freq: Four times a day (QID) | OPHTHALMIC | 0 refills | Status: AC
  Filled 2023-12-11: qty 10, 30d supply, fill #0

## 2023-12-17 ENCOUNTER — Other Ambulatory Visit (HOSPITAL_COMMUNITY): Payer: Self-pay

## 2023-12-17 ENCOUNTER — Other Ambulatory Visit (HOSPITAL_BASED_OUTPATIENT_CLINIC_OR_DEPARTMENT_OTHER): Payer: Self-pay

## 2023-12-17 DIAGNOSIS — Z719 Counseling, unspecified: Secondary | ICD-10-CM

## 2023-12-17 MED ORDER — FLUOROMETHOLONE 0.1 % OP SUSP
1.0000 [drp] | Freq: Four times a day (QID) | OPHTHALMIC | 0 refills | Status: AC
  Filled 2023-12-17: qty 5, 25d supply, fill #0

## 2023-12-18 DIAGNOSIS — E559 Vitamin D deficiency, unspecified: Secondary | ICD-10-CM | POA: Diagnosis not present

## 2023-12-18 DIAGNOSIS — E7800 Pure hypercholesterolemia, unspecified: Secondary | ICD-10-CM | POA: Diagnosis not present

## 2023-12-18 DIAGNOSIS — R7303 Prediabetes: Secondary | ICD-10-CM | POA: Diagnosis not present

## 2023-12-18 DIAGNOSIS — Z6824 Body mass index (BMI) 24.0-24.9, adult: Secondary | ICD-10-CM | POA: Diagnosis not present

## 2023-12-31 DIAGNOSIS — D485 Neoplasm of uncertain behavior of skin: Secondary | ICD-10-CM | POA: Diagnosis not present

## 2023-12-31 DIAGNOSIS — L821 Other seborrheic keratosis: Secondary | ICD-10-CM | POA: Diagnosis not present

## 2023-12-31 DIAGNOSIS — L570 Actinic keratosis: Secondary | ICD-10-CM | POA: Diagnosis not present

## 2024-01-16 DIAGNOSIS — Z719 Counseling, unspecified: Secondary | ICD-10-CM

## 2024-01-29 ENCOUNTER — Other Ambulatory Visit (INDEPENDENT_AMBULATORY_CARE_PROVIDER_SITE_OTHER): Payer: Self-pay

## 2024-01-29 ENCOUNTER — Encounter: Payer: Self-pay | Admitting: Sports Medicine

## 2024-01-29 ENCOUNTER — Ambulatory Visit: Admitting: Sports Medicine

## 2024-01-29 ENCOUNTER — Other Ambulatory Visit (HOSPITAL_COMMUNITY): Payer: Self-pay

## 2024-01-29 DIAGNOSIS — M25571 Pain in right ankle and joints of right foot: Secondary | ICD-10-CM

## 2024-01-29 MED ORDER — METHYLPREDNISOLONE 4 MG PO TBPK
ORAL_TABLET | ORAL | 0 refills | Status: AC
  Filled 2024-01-29: qty 21, 6d supply, fill #0

## 2024-01-29 NOTE — Progress Notes (Signed)
 SUANNE Wall - 63 y.o. female MRN [id number]  Date of birth: 1960/12/03  Office Visit Note: Visit Date: 01/29/2024 PCP: Darrow Bussing, MD Referred by: Darrow Bussing, MD  Subjective: Chief Complaint  Patient presents with   Right Ankle - Pain   HPI: Laura Wall is a pleasant 63 y.o. female who presents today for evaluation of left ankle pain with injury x 7 weeks ago.  She does work as a Gaffer and about 7 weeks ago she was in a plantarflexed position onto her toes when she felt a sudden sharp pain with a small pop in the front aspect of the ankle.  She had pulled back from activity and was doing okay but she was using a Contractor and she had that sharp pain that reoccurred but this was when she was in a dorsiflexed position.  Her pain is not all the time, but does come and go and it can be sharp at times even radiating up the anterior calf/shin at times. She denies any numbness or tingling or extension into the toes.  She initially used an ankle brace and then has been applying KT tape over the past few weeks which has certainly helped.  She does use meloxicam on an every other day basis for her general arthritic pains.  Notable surgical history for the right foot includes a right foot neuroma removal.  Pertinent ROS were reviewed with the patient and found to be negative unless otherwise specified above in HPI.   Assessment & Plan: Visit Diagnoses:  1. Pain in right ankle and joints of right foot    Plan: Impression is chronic right anterior ankle pain after an injury and a plantar flexed position up onto the toes.  Did feel a sharp pain and a small pop at the although does not have any swelling, fusion or weakness about the ankle during testing today.  She does have some pain with deep palpation within the anterior ankle joint on the lateral side.  She does not have interosseous instability or high ankle sprain.  I discussed with Geneve possible etiology of her pain  which may be a degree of synovitis given her load and injury over the ankle joint.  A higher grade sprain of the AIFL (ligament) is also possible given her mechanism and her location of pain. Lower threshold for OCD or cartilage defect of the anterior joint recess given no significant effusion, but cannot rule out without MRI.  We discussed further workup including MRI, like to hold on this for now which is understanding.  We will get her started for ankle stability and strengthening.  I will place her on a 6-day Medrol Dosepak to help with localized pain and inflammation and then discontinue.   We also discussed a one-time injection into the joint.  We will see how she does over the coming weeks with the above treatment and her ankle rehab exercises and she will keep me updated.  If still having pain or instability, next step may include MRI of the ankle, could always consider 1-time injection into the ankle as well.  Follow-up: Return if symptoms worsen or fail to improve.   Meds & Orders:  Meds ordered this encounter  Medications   methylPREDNISolone (MEDROL DOSEPAK) 4 MG TBPK tablet    Sig: Take as directed per package    Dispense:  21 each    Refill:  0    Orders Placed This Encounter  Procedures   XR  Ankle Complete Right     Procedures: No procedures performed      Clinical History: No specialty comments available.  She reports that she quit smoking about 31 years ago. Her smoking use included cigarettes. She started smoking about 41 years ago. She has a 30 pack-year smoking history. She has never used smokeless tobacco. No results for input(s): "HGBA1C", "LABURIC" in the last 8760 hours.  Objective:    Physical Exam  Gen: Well-appearing, in no acute distress; non-toxic CV: Well-perfused. Warm.  Resp: Breathing unlabored on room air; no wheezing. Psych: Fluid speech in conversation; appropriate affect; normal thought process  Ortho Exam - Right ankle/foot: There is no  redness swelling or forefoot.  There is tenderness with deep palpation over the anterior joint recess on the lateral side.  There is full range of motion with plantarflexion and dorsiflexion.  Negative anterior drawer.  There is some pain placing the foot in plantarflexion and inversion.  There is no pain over either malleoli.  Negative Klieger's test.  Imaging: XR Ankle Complete Right Result Date: 01/29/2024 3 views of the right ankle including AP, mortise, and lateral film were ordered and reviewed by myself today.  X-rays demonstrate no acute fracture or acute bony abnormality noted.  The ankle mortise is symmetric. There is minimal to mild tibiotalar joint and midfoot arthritic change.  Past Medical/Family/Surgical/Social History: Medications & Allergies reviewed per EMR, new medications updated. Patient Active Problem List   Diagnosis Date Noted   Neuroma 09/19/2020   Malignant melanoma (HCC) 06/26/2018   Changing skin lesion 08/07/2016   Encounter for counseling 03/18/2015   Symptomatic mammary hypertrophy 10/07/2014   Fibroid    Vertigo    Ovarian cyst 03/17/2012   Past Medical History:  Diagnosis Date   Cancer (HCC)    basal and squamous   Complication of anesthesia    woke up during some surgeries   Frequency of urination    History of kidney stones    Iron deficiency anemia    Seasonal allergies    SUI (stress urinary incontinence, female)    Toe fracture, left    3rd toe   Ureteral stone pt states stone embedded in urethral sling   Urgency of urination    Family History  Problem Relation Age of Onset   Cancer Mother        multiple myeloma   Cancer Father 80       lung   Breast cancer Paternal Aunt    Breast cancer Paternal Aunt    Breast cancer Paternal Aunt    Colon cancer Neg Hx    Colon polyps Neg Hx    Esophageal cancer Neg Hx    Rectal cancer Neg Hx    Stomach cancer Neg Hx    Past Surgical History:  Procedure Laterality Date   ABDOMINAL  HYSTERECTOMY     BENIGN LEFT BREAST LUMPECTOMY  1985   BREAST LUMPECTOMY WITH RADIOACTIVE SEED LOCALIZATION Right 10/07/2014   Procedure: RIGHT BREAST LUMPECTOMY WITH RADIOACTIVE SEED LOCALIZATION;  Surgeon: Emelia Loron, MD;  Location: [facility];  Service: General;  Laterality: Right;   BREAST RECONSTRUCTION Bilateral 09/2014   BREAST REDUCTION SURGERY Bilateral 10/07/2014   Procedure: BILATERAL MAMMARY REDUCTION  (BREAST);  Surgeon: Wayland Denis, DO;  Location: [facility];  Service: Plastics;  Laterality: Bilateral;   COLD KNIFE CERVICAL CONIZATION  1982   COLONOSCOPY     COLONOSCOPY WITH PROPOFOL  01/15/2022   CYSTOSCOPY  08/29/2012  Procedure: CYSTOSCOPY;  Surgeon: Sebastian Ache, MD;  Location: [facility];  Service: Urology;  Laterality: N/A;   ECTOPIC PREGNANCY SURGERY  2003   BILATERAL FIMBRIECTOMIES (TUBAL RUPTURE)   EXCISION MELANOMA LESION RIGHT TOE  2009   HOLMIUM LASER APPLICATION  08/29/2012   Procedure: HOLMIUM LASER APPLICATION;  Surgeon: Sebastian Ache, MD;  Location: [facility];  Service: Urology;  Laterality: N/A;  There is no left or right designated because the stone is embedded in the urethral sling   HYSTEROSCOPY W/  NOVASURE ENDOMETRIAL ABLATION  2008   AND URETHRAL SLING PLACEMENT   LIPOSUCTION Bilateral 10/07/2014   Procedure: LIPOSUCTION;  Surgeon: Wayland Denis, DO;  Location: [facility];  Service: Plastics;  Laterality: Bilateral;   NEUROMA SURGERY  2022   ORIF TOE FRACTURE Left 01/12/2016   Procedure: OPEN REDUCTION INTERNAL FIXATION (ORIF) LEFT THIRD TOE FRACTURE;  Surgeon: Toni Arthurs, MD;  Location: [facility];  Service: Orthopedics;  Laterality: Left;   ROBOTIC-ASSISTED TOTAL HYSTERECOTMY W/ LEFT OVARIAN CYSTECTOMY  01-14-2009  DR RIVARD   CHRONIC PELVIC PAIN/ UTERINE FIBROIDS/ ENDOMETRIOSIS/ ANEMIA   SACROPLASTY  2021   TUBAL LIGATION  2003   BILATERAL    Social History   Occupational History   Occupation: Research officer, [employer] party [employer]  Tobacco Use   Smoking status: Former    Current packs/day: 0.00    Average packs/day: 3.0 packs/day for 10.0 years (30.0 ttl pk-yrs)    Types: Cigarettes    Start date: 08/26/1982    Quit date: 08/26/1992    Years since quitting: 31.4   Smokeless tobacco: Never  Vaping Use   Vaping status: Never Used  Substance and Sexual Activity   Alcohol use: Yes    Alcohol/week: 2.0 standard drinks of alcohol    Types: 2 Glasses of wine per week    Comment: social   Drug use: No   Sexual activity: Yes    Birth control/protection: Surgical, Post-menopausal

## 2024-01-29 NOTE — Progress Notes (Signed)
 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 She says that she felt a pop and sharp pain in the front of the ankle. She has had this pop and sharp pain one other time when using a reformer; she says that this time the ball of her foot was on the bar and she dropped into dorsiflexion. She has had pain that comes and goes in the time since then, but she will get random sharp pain that shoots up the leg. She recently had a pedicure and says that during this appointment she experienced that sharp shooting pain. She has used KT tape in the last couple of weeks and believes that has helped some. She does not take any medication specifically for the ankle, but does take Meloxicam regularly for arthritis. Patient has a history of left foot jones fracture, as well as right foot neuroma surgery, within the last few years.

## 2024-02-03 ENCOUNTER — Encounter: Payer: Self-pay | Admitting: Nurse Practitioner

## 2024-02-03 ENCOUNTER — Ambulatory Visit: Payer: Commercial Managed Care - PPO | Attending: Nurse Practitioner | Admitting: Nurse Practitioner

## 2024-02-03 VITALS — BP 106/84 | HR 75 | Ht 70.0 in | Wt 161.0 lb

## 2024-02-03 DIAGNOSIS — F419 Anxiety disorder, unspecified: Secondary | ICD-10-CM | POA: Diagnosis not present

## 2024-02-03 DIAGNOSIS — R002 Palpitations: Secondary | ICD-10-CM

## 2024-02-03 DIAGNOSIS — I471 Supraventricular tachycardia, unspecified: Secondary | ICD-10-CM

## 2024-02-03 DIAGNOSIS — E782 Mixed hyperlipidemia: Secondary | ICD-10-CM | POA: Diagnosis not present

## 2024-02-03 NOTE — Patient Instructions (Signed)
 Medication Instructions:  Your physician recommends that you continue on your current medications as directed. Please refer to the Current Medication list given to you today.  *If you need a refill on your cardiac medications before your next appointment, please call your pharmacy*  Lab Work: NONE ordered at this time of appointment   Testing/Procedures: NONE ordered at this time of appointment   Follow-Up: At [facility], you and your health needs are our priority.  As part of our continuing mission to provide you with exceptional heart care, our providers are all part of one team.  This team includes your primary Cardiologist (physician) and Advanced Practice Providers or APPs (Physician Assistants and Nurse Practitioners) who all work together to provide you with the care you need, when you need it.  Your next appointment:   6 month(s)  Provider:   Reatha Harps, MD     We recommend signing up for the patient portal called "MyChart".  Sign up information is provided on this After Visit Summary.  MyChart is used to connect with patients for Virtual Visits (Telemedicine).  Patients are able to view lab/test results, encounter notes, upcoming appointments, etc.  Non-urgent messages can be sent to your provider as well.   To learn more about what you can do with MyChart, go to [email or web address].         1st Floor: - Lobby - Registration  - Pharmacy  - Lab - Cafe  2nd Floor: - PV Lab - Diagnostic Testing (echo, CT, nuclear med)  3rd Floor: - Vacant  4th Floor: - TCTS (cardiothoracic surgery) - AFib Clinic - Structural Heart Clinic - Vascular Surgery  - Vascular Ultrasound  5th Floor: - [facility] (general and EP) - Clinical Pharmacy for coumadin, hypertension, lipid, weight-loss medications, and med management appointments    Valet parking services will be available as well.

## 2024-02-03 NOTE — Progress Notes (Signed)
 Office Visit    Patient Name: Laura Wall Date of Encounter: 02/03/2024  Primary Care Provider:  Darrow Bussing, MD Primary Cardiologist:  Reatha Harps, MD  Chief Complaint    63 year old female with a history of palpitations, SVT, and hyperlipidemia who presents for follow-up related to SVT/palpitations.   Past Medical History    Past Medical History:  Diagnosis Date   Cancer (HCC)    basal and squamous   Complication of anesthesia    woke up during some surgeries   Frequency of urination    History of kidney stones    Iron deficiency anemia    Seasonal allergies    SUI (stress urinary incontinence, female)    Toe fracture, left    3rd toe   Ureteral stone pt states stone embedded in urethral sling   Urgency of urination    Past Surgical History:  Procedure Laterality Date   ABDOMINAL HYSTERECTOMY     BENIGN LEFT BREAST LUMPECTOMY  1985   BREAST LUMPECTOMY WITH RADIOACTIVE SEED LOCALIZATION Right 10/07/2014   Procedure: RIGHT BREAST LUMPECTOMY WITH RADIOACTIVE SEED LOCALIZATION;  Surgeon: Emelia Loron, MD;  Location: [facility];  Service: General;  Laterality: Right;   BREAST RECONSTRUCTION Bilateral 09/2014   BREAST REDUCTION SURGERY Bilateral 10/07/2014   Procedure: BILATERAL MAMMARY REDUCTION  (BREAST);  Surgeon: Wayland Denis, DO;  Location: [facility];  Service: Plastics;  Laterality: Bilateral;   COLD KNIFE CERVICAL CONIZATION  1982   COLONOSCOPY     COLONOSCOPY WITH PROPOFOL  01/15/2022   CYSTOSCOPY  08/29/2012   Procedure: CYSTOSCOPY;  Surgeon: Sebastian Ache, MD;  Location: [facility];  Service: Urology;  Laterality: N/A;   ECTOPIC PREGNANCY SURGERY  2003   BILATERAL FIMBRIECTOMIES (TUBAL RUPTURE)   EXCISION MELANOMA LESION RIGHT TOE  2009   HOLMIUM LASER APPLICATION  08/29/2012   Procedure: HOLMIUM LASER APPLICATION;  Surgeon: Sebastian Ache, MD;  Location: [facility];  Service:  Urology;  Laterality: N/A;  There is no left or right designated because the stone is embedded in the urethral sling   HYSTEROSCOPY W/  NOVASURE ENDOMETRIAL ABLATION  2008   AND URETHRAL SLING PLACEMENT   LIPOSUCTION Bilateral 10/07/2014   Procedure: LIPOSUCTION;  Surgeon: Wayland Denis, DO;  Location: [facility];  Service: Plastics;  Laterality: Bilateral;   NEUROMA SURGERY  2022   ORIF TOE FRACTURE Left 01/12/2016   Procedure: OPEN REDUCTION INTERNAL FIXATION (ORIF) LEFT THIRD TOE FRACTURE;  Surgeon: Toni Arthurs, MD;  Location: [facility];  Service: Orthopedics;  Laterality: Left;   ROBOTIC-ASSISTED TOTAL HYSTERECOTMY W/ LEFT OVARIAN CYSTECTOMY  01-14-2009  DR RIVARD   CHRONIC PELVIC PAIN/ UTERINE FIBROIDS/ ENDOMETRIOSIS/ ANEMIA   SACROPLASTY  2021   TUBAL LIGATION  2003   BILATERAL    Allergies  Allergies  Allergen Reactions   Codeine Nausea And Vomiting   Latex Rash   Penicillins Rash   Sulfa Antibiotics Rash    severe     Labs/Other Studies Reviewed    The following studies were reviewed today:  Cardiac Studies & Procedures   ______________________________________________________________________________________________     ECHOCARDIOGRAM  ECHOCARDIOGRAM COMPLETE 10/02/2023  Narrative ECHOCARDIOGRAM REPORT    Patient Name:   Laura Wall Date of Exam: 10/02/2023 Medical Rec #:  [id number]      Height:       70.0 in Accession #:    [id number]     Weight:  179.2 lb Date of Birth:  [birth year]       BSA:          1.992 m Patient Age:    62 years       BP:           122/80 mmHg Patient Gender: F              HR:           69 bpm. Exam Location:  [address]  Procedure: 2D Echo, Cardiac Doppler, Color Doppler, 3D Echo and Strain Analysis  Indications:    I47.1 SVT  History:        Patient has no prior history of Echocardiogram examinations. Risk Factors:Dyslipidemia. Palpitations.  Sonographer:    Cathie Beams  RCS Referring Phys: Ronnald Ramp O'NEAL  IMPRESSIONS   1. Left ventricular ejection fraction, by estimation, is 60 to 65%. The left ventricle has normal function. The left ventricle has no regional wall motion abnormalities. Left ventricular diastolic parameters were normal. 2. Right ventricular systolic function is normal. The right ventricular size is normal. 3. The mitral valve is grossly normal. No evidence of mitral valve regurgitation. 4. The aortic valve was not well visualized. Aortic valve regurgitation is not visualized. 5. The inferior vena cava is normal in size with greater than 50% respiratory variability, suggesting right atrial pressure of 3 mmHg.  Comparison(s): No prior Echocardiogram.  Conclusion(s)/Recommendation(s): Normal biventricular function without evidence of hemodynamically significant valvular heart disease.  FINDINGS Left Ventricle: Left ventricular ejection fraction, by estimation, is 60 to 65%. The left ventricle has normal function. The left ventricle has no regional wall motion abnormalities. The left ventricular internal cavity size was normal in size. There is no left ventricular hypertrophy. Left ventricular diastolic parameters were normal.  Right Ventricle: The right ventricular size is normal. Right ventricular systolic function is normal.  Left Atrium: Left atrial size was normal in size.  Right Atrium: Right atrial size was normal in size.  Pericardium: There is no evidence of pericardial effusion.  Mitral Valve: The mitral valve is grossly normal. No evidence of mitral valve regurgitation.  Tricuspid Valve: Tricuspid valve regurgitation is not demonstrated.  Aortic Valve: The aortic valve was not well visualized. Aortic valve regurgitation is not visualized.  Pulmonic Valve: Pulmonic valve regurgitation is not visualized.  Aorta: The aortic root and ascending aorta are structurally normal, with no evidence of dilitation.  Venous: The  inferior vena cava is normal in size with greater than 50% respiratory variability, suggesting right atrial pressure of 3 mmHg.  IAS/Shunts: The interatrial septum was not well visualized.   LEFT VENTRICLE PLAX 2D LVIDd:         4.10 cm   Diastology LVIDs:         2.90 cm   LV e' medial:    11.60 cm/s LV PW:         0.90 cm   LV E/e' medial:  7.9 LV IVS:        1.00 cm   LV e' lateral:   10.60 cm/s LVOT diam:     2.00 cm   LV E/e' lateral: 8.7 LV SV:         78 LV SV Index:   39 LVOT Area:     3.14 cm  3D Volume EF: 3D EF:        68 % LV EDV:       153 ml LV ESV:  48 ml LV SV:        105 ml  RIGHT VENTRICLE RV S prime:     10.90 cm/s  LEFT ATRIUM             Index LA diam:        3.50 cm 1.76 cm/m LA Vol (A2C):   52.7 ml 26.45 ml/m LA Vol (A4C):   43.7 ml 21.94 ml/m LA Biplane Vol: 48.2 ml 24.20 ml/m AORTIC VALVE LVOT Vmax:   125.00 cm/s LVOT Vmean:  73.600 cm/s LVOT VTI:    0.248 m  AORTA Ao Root diam: 2.70 cm Ao Asc diam:  3.30 cm  MITRAL VALVE MV Area (PHT): 3.91 cm    SHUNTS MV Decel Time: 194 msec    Systemic VTI:  0.25 m MV E velocity: 92.20 cm/s  Systemic Diam: 2.00 cm MV A velocity: 61.30 cm/s MV E/A ratio:  1.50  Photographer signed by Carolan Clines Signature Date/Time: 10/02/2023/3:00:32 PM    Final    MONITORS  LONG TERM MONITOR (3-14 DAYS) 09/19/2023  Narrative Patch Wear Time:  7 days and 0 hours (2024-11-05T03:57:29-0500 to 2024-11-12T04:08:28-0500)  Patient had a min HR of 56 bpm (sinus bradycardia), max HR of 193 bpm (supraventricular tachycardia) and avg HR of 77 bpm (normal sinus rhythm). Predominant underlying rhythm was Sinus Rhythm. 15 Supraventricular Tachycardia runs occurred, the run with the fastest interval lasting 5 beats (2 second duration) with a max rate of 193 bpm, the longest lasting 18 beats (8.3 second duration) with an avg rate of 134 bpm. Isolated SVEs were rare (<1.0%), SVE Couplets were rare  (<1.0%), and SVE Triplets were rare (<1.0%). Isolated VEs were rare (<1.0%), VE Couplets were rare (<1.0%), and no VE Triplets were present.  Impression: Brief SVT present (15 episodes in 7 days; longest duration 8.3 seconds). Rare ectopy.  Gerri Spore T. Flora Lipps, MD, [facility]  [facility] [address] [place], [place] [address] [phone] 8:44 PM   CT SCANS  CT CARDIAC SCORING (SELF PAY ONLY) 03/05/2022  Addendum 03/06/2022  5:59 AM ADDENDUM REPORT: 03/06/2022 05:56  CLINICAL DATA:  67F for cardiovascular disease risk stratification  EXAM: Coronary Calcium Score  TECHNIQUE: A gated, non-contrast computed tomography scan of the heart was performed using 3mm slice thickness. Axial images were analyzed on a dedicated workstation. Calcium scoring of the coronary arteries was performed using the Agatston method.  FINDINGS: Coronary arteries: Normal origins.  Coronary Calcium Score: 0  Pericardium: Normal.  Ascending Aorta: Normal caliber.  2.9 cm.  Aortic atherosclerosis.  Non-cardiac: See separate report from [facility].  IMPRESSION: Coronary calcium score of 0. This was 0 percentile for age-, race-, and sex-matched controls.  Mild aortic atherosclerosis.  RECOMMENDATIONS: Coronary artery calcium (CAC) score is a strong predictor of incident coronary heart disease (CHD) and provides predictive information beyond traditional risk factors. CAC scoring is reasonable to use in the decision to withhold, postpone, or initiate statin therapy in intermediate-risk or selected borderline-risk asymptomatic adults (age 59-75 years and LDL-C >=70 to <190 mg/dL) who do not have diabetes or established atherosclerotic cardiovascular disease (ASCVD).* In intermediate-risk (10-year ASCVD risk >=7.5% to <20%) adults or selected borderline-risk (10-year ASCVD risk >=5% to <7.5%) adults in whom a CAC score is measured for the purpose of making a  treatment decision the following recommendations have been made:  If CAC=0, it is reasonable to withhold statin therapy and reassess in 5 to 10 years, as long as higher  risk conditions are absent (diabetes mellitus, family history of premature CHD in first degree relatives (males <55 years; females <65 years), cigarette smoking, or LDL >=190 mg/dL).  If CAC is 1 to 99, it is reasonable to initiate statin therapy for patients >=17 years of age.  If CAC is >=100 or >=75th percentile, it is reasonable to initiate statin therapy at any age.  Cardiology referral should be considered for patients with CAC scores >=400 or >=75th percentile.  *2018 AHA/ACC/AACVPR/AAPA/ABC/ACPM/ADA/AGS/APhA/ASPC/NLA/PCNA Guideline on the Management of Blood Cholesterol: A Report of the American College of Cardiology/American Heart Association Task Force on Clinical Practice Guidelines. J Am Coll Cardiol. 2019;73(24):3168-3209.  Chilton Si, MD   Electronically Signed By: Chilton Si M.D. On: 03/06/2022 05:56  Narrative CLINICAL DATA:  This over-read does not include interpretation of cardiac or coronary anatomy or pathology. The coronary calcium score interpretation by the cardiologist is attached.  COMPARISON:  None Available.  FINDINGS: Vascular: High takeoff of the RCA. No significant extracardiac vascular findings.  Mediastinum/Nodes: No lymphadenopathy.  Lungs/Pleura: There is a 5 mm part solid nodule in the left lateral lower lobe (series 2, image 28). Elevated left hemidiaphragm.  Upper Abdomen: No acute abnormality. Diverticulosis of the splenic flexure.  Musculoskeletal: No acute osseous abnormality. No suspicious lytic or blastic lesions.  IMPRESSION: 5 mm part solid nodule in the left lower lobe. No follow-up recommended. This recommendation follows the consensus statement: Guidelines for Management of Incidental Pulmonary Nodules Detected on CT Images: From the  Fleischner Society 2017; Radiology 2017; 284:228-243.  Interpretation of cardiac/coronary anatomy by cardiology to follow.  Electronically Signed: By: Caprice Renshaw M.D. On: 03/05/2022 11:24     ______________________________________________________________________________________________     Recent Labs: 08/26/2023: ALT 32; BUN 11; Creatinine, Ser 0.81; Hemoglobin 15.1; Platelets 332; Potassium 4.3; Sodium 139  Recent Lipid Panel No results found for: "CHOL", "TRIG", "HDL", "CHOLHDL", "VLDL", "LDLCALC", "LDLDIRECT"  History of Present Illness    63 year old female with the above past medical history including palpitations, SVT, and hyperlipidemia.   Coronary calcium score in 2023 was 0.  She was evaluated in the ED in October 2024 in the setting of elevated heart rate, elevated blood pressure, numbness in her legs.  ER workup was unremarkable.  She was referred to cardiology.    7-day ZIO monitor in 08/2023 in the setting of palpitations revealed brief episodes of PSVT (15 episodes in 7 days, longest duration 8.3 seconds), rare ectopy.  Echocardiogram was normal.  She was started on metoprolol. She was last seen in the office on 11/04/2023 and noted intermittent palpitations associated with high stress levels.  She had self discontinued her metoprolol due to concern for side effects of dizziness. She was advised to take metoprolol tartrate 25 mg twice daily as needed for palpitations, HR greater than 120 bpm.  Additionally, she was encouraged to start taking sertraline as prescribed by her PCP.   She presents today for follow-up.  Since her last visit she has been stable overall from a cardiac standpoint.  She continues to notice intermittent fluttering in her chest, no associated symptoms.  Palpitations usually occur in the setting of high stress.  Symptoms have decreased in frequency overall.  She has not required metoprolol. She remains active, continues to teach Pilates.   Home Medications     Current Outpatient Medications  Medication Sig Dispense Refill   diazepam (VALIUM) 5 MG tablet Take 5 mg by mouth as needed for anxiety.     famotidine (PEPCID) 20  MG tablet Take 1 tablet (20 mg total) by mouth 2 (two) times daily. 60 tablet 11   FeFum-FePoly-FA-B Cmp-C-Biot (FOLIVANE-PLUS) CAPS Take 1 capsule every day by oral route. 90 capsule 3   fluorometholone (FML FORTE) 0.25 % ophthalmic suspension Place 1 drop into both eyes 4 (four) times daily for 2 weeks then taper to 1 drop 2 times daily for 2 additional weeks. 10 mL 0   fluorometholone (FML LIQUIFILM) 0.1 % ophthalmic suspension Place 1 drop into both eyes 4 (four) times daily for 2 weeks, then 2 times daily for 2 weeks 5 mL 0   meclizine (ANTIVERT) 12.5 MG tablet Take 1 tablet (12.5 mg total) by mouth 3 (three) times daily as needed for dizziness. 30 tablet 0   meloxicam (MOBIC) 15 MG tablet Take 1 tablet (15 mg total) by mouth every other day. 30 tablet 2   methylPREDNISolone (MEDROL DOSEPAK) 4 MG TBPK tablet Take as directed per package 21 each 0   metoprolol tartrate (LOPRESSOR) 25 MG tablet Take 1 tablet (25 mg total) by mouth 2 (two) times daily as needed only for palpitations or heart rate grater than 120 bpm 180 tablet 3   sertraline (ZOLOFT) 25 MG tablet Take 1 tablet (25 mg total) by mouth daily. 30 tablet 5   valACYclovir (VALTREX) 1000 MG tablet Take 1 g by mouth as needed.     amphetamine-dextroamphetamine (ADDERALL XR) 20 MG 24 hr capsule Take 1 capsule (20 mg total) by mouth in the morning. (Patient not taking: Reported on 08/30/2023) 30 capsule 0   cholecalciferol (VITAMIN D3) 25 MCG (1000 UNIT) tablet Take 1,000 Units by mouth daily.     clindamycin (CLEOCIN) 150 MG capsule Take 2 capsules (300 mg total) by mouth now AND 1 capsule (150 mg total) 3 (three) times daily for dental infection (Patient not taking: Reported on 08/30/2023) 21 capsule 1   diazepam (VALIUM) 10 MG tablet Take 1 tablet (10 mg total) by mouth as  needed for vertigo. (90 day supply) 30 tablet 0   Semaglutide-Weight Management (WEGOVY) 1.7 MG/0.75ML SOAJ Inject 1.7 mg into the skin once a week. 3 mL 1   No current facility-administered medications for this visit.     Review of Systems    She denies chest pain, dyspnea, pnd, orthopnea, n, v, dizziness, syncope, edema, weight gain, or early satiety. All other systems reviewed and are otherwise negative except as noted above.   Physical Exam    VS:  BP 106/84 (BP Location: Left Arm, Patient Position: Sitting, Cuff Size: Normal)   Pulse 75   Ht 5\' 10"  (1.778 m)   Wt 161 lb (73 kg)   SpO2 98%   BMI 23.10 kg/m   GEN: Well nourished, well developed, in no acute distress. HEENT: normal. Neck: Supple, no JVD, carotid bruits, or masses. Cardiac: RRR, no murmurs, rubs, or gallops. No clubbing, cyanosis, edema.  Radials/DP/PT 2+ and equal bilaterally.  Respiratory:  Respirations regular and unlabored, clear to auscultation bilaterally. GI: Soft, nontender, nondistended, BS + x 4. MS: no deformity or atrophy. Skin: warm and dry, no rash. Neuro:  Strength and sensation are intact. Psych: Normal affect.  Accessory Clinical Findings    ECG personally reviewed by me today -    - no EKG in office today.    Lab Results  Component Value Date   WBC 6.5 08/26/2023   HGB 15.1 (H) 08/26/2023   HCT 44.7 08/26/2023   MCV 86.5 08/26/2023   PLT 332  08/26/2023   Lab Results  Component Value Date   CREATININE 0.81 08/26/2023   BUN 11 08/26/2023   NA 139 08/26/2023   K 4.3 08/26/2023   CL 106 08/26/2023   CO2 26 08/26/2023   Lab Results  Component Value Date   ALT 32 08/26/2023   AST 22 08/26/2023   ALKPHOS 85 08/26/2023   BILITOT 0.3 08/26/2023   No results found for: "CHOL", "HDL", "LDLCALC", "LDLDIRECT", "TRIG", "CHOLHDL"  No results found for: "HGBA1C"  Assessment & Plan    1. Palpitations/PSVT:  7-day ZIO monitor in 08/2023 revealed brief episodes of PSVT (15 episodes in 7  days, longest duration 8.3 seconds), rare ectopy.   Echocardiogram in 09/2023 was normal.  She continues to note intermittent fluttering in her chest, no associated symptoms.  Palpitations are associated with stress, have decreased in frequency overall.  She has not required metoprolol.  Continue metoprolol as needed.   2. Hyperlipidemia: LDL 140 in 10/2023, however, coronary calcium score of 0.  Statin therapy was previously recommended, however, she prefers to work on lifestyle modifications with diet and exercise.     3. Stress management/anxiety: She was prescribed sertraline per her PCP but has not started taking the medication.  She does not wish to start the medication at this time.  Recommend follow-up with PCP.   4. Disposition: Follow-up in 6 months, sooner if needed.       Joylene Grapes, NP 02/03/2024, 9:48 AM

## 2024-02-05 DIAGNOSIS — Z6824 Body mass index (BMI) 24.0-24.9, adult: Secondary | ICD-10-CM | POA: Diagnosis not present

## 2024-02-05 DIAGNOSIS — E7800 Pure hypercholesterolemia, unspecified: Secondary | ICD-10-CM | POA: Diagnosis not present

## 2024-02-05 DIAGNOSIS — R7303 Prediabetes: Secondary | ICD-10-CM | POA: Diagnosis not present

## 2024-02-05 DIAGNOSIS — E559 Vitamin D deficiency, unspecified: Secondary | ICD-10-CM | POA: Diagnosis not present

## 2024-02-13 ENCOUNTER — Ambulatory Visit (INDEPENDENT_AMBULATORY_CARE_PROVIDER_SITE_OTHER): Payer: Self-pay | Admitting: Student

## 2024-02-13 DIAGNOSIS — Z719 Counseling, unspecified: Secondary | ICD-10-CM

## 2024-02-13 NOTE — Progress Notes (Signed)
 Preoperative Dx: Hyperpigmentation to the bilateral forearms and hands and 3 spots of hyperpigmentation to the right lower extremity calf  Postoperative Dx:  same  Procedure: Laser to the bilateral forearms, hands and right lower extremity  Anesthesia: none  Description of Procedure:  Risks and complications were explained to the patient. Consent was confirmed. Time out was called and all information was confirmed to be correct. The area  area was prepped with alcohol and wiped dry. BBL laser was set at 4 J/cm2 using 515 nm filter with 3 width and 20 degrees Celsius. The bilateral forearms and hands was lasered.  The BBL laser was then set to the pigment setting and the square adapter was placed on the handle.  The laser was set to 7 J/cm using a pulse with a 20 and using a 515 nm filter at 15 C.  The bilateral forearms were lasered.  The BBL pigment laser was then set to 13 J/cm at a pulse with a 15 using the 515 nm filter at 15 C.  The 11 mm circular adapter was placed on the handpiece.  The 3 spots noted to the right lower extremity were lasered as well as a area of hyperpigmentation noted to the base of the right thumb. The patient tolerated the procedure well and there were no complications.

## 2024-02-18 ENCOUNTER — Other Ambulatory Visit (HOSPITAL_COMMUNITY): Payer: Self-pay

## 2024-02-19 ENCOUNTER — Other Ambulatory Visit: Payer: Self-pay

## 2024-02-20 DIAGNOSIS — E7800 Pure hypercholesterolemia, unspecified: Secondary | ICD-10-CM | POA: Diagnosis not present

## 2024-02-20 DIAGNOSIS — F411 Generalized anxiety disorder: Secondary | ICD-10-CM | POA: Diagnosis not present

## 2024-03-04 ENCOUNTER — Other Ambulatory Visit (HOSPITAL_COMMUNITY): Payer: Self-pay

## 2024-03-12 ENCOUNTER — Ambulatory Visit: Payer: Self-pay | Admitting: Surgical

## 2024-03-12 DIAGNOSIS — Z719 Counseling, unspecified: Secondary | ICD-10-CM

## 2024-03-12 NOTE — Progress Notes (Signed)
 Preoperative Dx: hyperpigmentation, cherry angioma, laser hair removal chin/perioral  Postoperative Dx:  same  Procedure: laser to chest, face and hands/arms   Anesthesia: none  Description of Procedure:  Risks and complications were explained to the patient. Consent was confirmed and signed. Time out was called and all information was confirmed to be correct. The area  area was prepped with alcohol and wiped dry.   The 640 nm laser was used to target perioral and chin hair, a lot of the hairs were noted to be lighter in color, therefore laser had difficulty picking up pigment.  Skin tolerated this well.  The 515 nm laser was used to target hyperpigmentation/sun spots of bilateral hands and arms.  515 nm laser was then used to target cherry angiomas of chest and left arm.  Patient tolerated this well.  There was no complications. Expected endpoints were noted.

## 2024-03-17 ENCOUNTER — Ambulatory Visit (INDEPENDENT_AMBULATORY_CARE_PROVIDER_SITE_OTHER): Payer: Self-pay | Admitting: Plastic Surgery

## 2024-03-17 ENCOUNTER — Encounter: Payer: Self-pay | Admitting: Plastic Surgery

## 2024-03-17 DIAGNOSIS — Z719 Counseling, unspecified: Secondary | ICD-10-CM

## 2024-03-17 NOTE — Progress Notes (Signed)

## 2024-03-18 ENCOUNTER — Other Ambulatory Visit (HOSPITAL_COMMUNITY): Payer: Self-pay

## 2024-03-19 ENCOUNTER — Other Ambulatory Visit (HOSPITAL_COMMUNITY): Payer: Self-pay

## 2024-03-19 ENCOUNTER — Encounter: Payer: Self-pay | Admitting: Pharmacist

## 2024-03-19 MED ORDER — FOLIVANE-PLUS PO CAPS
1.0000 | ORAL_CAPSULE | Freq: Every day | ORAL | 3 refills | Status: AC
  Filled 2024-03-19: qty 90, 90d supply, fill #0
  Filled 2024-08-27: qty 90, 90d supply, fill #1
  Filled 2024-09-29: qty 90, 90d supply, fill #2

## 2024-03-20 ENCOUNTER — Other Ambulatory Visit (HOSPITAL_COMMUNITY): Payer: Self-pay

## 2024-03-25 ENCOUNTER — Ambulatory Visit: Admitting: Sports Medicine

## 2024-03-27 ENCOUNTER — Other Ambulatory Visit: Payer: Self-pay | Admitting: Medical Genetics

## 2024-04-13 ENCOUNTER — Other Ambulatory Visit: Payer: Self-pay

## 2024-04-13 DIAGNOSIS — Z006 Encounter for examination for normal comparison and control in clinical research program: Secondary | ICD-10-CM

## 2024-04-14 ENCOUNTER — Other Ambulatory Visit: Payer: Self-pay

## 2024-04-14 DIAGNOSIS — Z1231 Encounter for screening mammogram for malignant neoplasm of breast: Secondary | ICD-10-CM | POA: Diagnosis not present

## 2024-04-16 DIAGNOSIS — Z6824 Body mass index (BMI) 24.0-24.9, adult: Secondary | ICD-10-CM | POA: Diagnosis not present

## 2024-04-16 DIAGNOSIS — R7303 Prediabetes: Secondary | ICD-10-CM | POA: Diagnosis not present

## 2024-04-16 DIAGNOSIS — E559 Vitamin D deficiency, unspecified: Secondary | ICD-10-CM | POA: Diagnosis not present

## 2024-04-16 DIAGNOSIS — E7800 Pure hypercholesterolemia, unspecified: Secondary | ICD-10-CM | POA: Diagnosis not present

## 2024-04-24 DIAGNOSIS — Z719 Counseling, unspecified: Secondary | ICD-10-CM

## 2024-04-24 LAB — GENECONNECT MOLECULAR SCREEN: Genetic Analysis Overall Interpretation: NEGATIVE

## 2024-05-25 ENCOUNTER — Other Ambulatory Visit (HOSPITAL_COMMUNITY): Payer: Self-pay

## 2024-05-25 ENCOUNTER — Other Ambulatory Visit (INDEPENDENT_AMBULATORY_CARE_PROVIDER_SITE_OTHER): Payer: Self-pay | Admitting: Physician Assistant

## 2024-05-25 MED ORDER — FAMOTIDINE 20 MG PO TABS
20.0000 mg | ORAL_TABLET | Freq: Two times a day (BID) | ORAL | 11 refills | Status: AC
  Filled 2024-05-27 – 2024-08-27 (×2): qty 60, 30d supply, fill #0
  Filled 2024-09-29: qty 60, 30d supply, fill #1
  Filled 2024-10-27: qty 60, 30d supply, fill #2

## 2024-05-25 NOTE — Progress Notes (Signed)
 Patient requesting refill of Pepcid , refill sent to pharmacy.

## 2024-05-26 ENCOUNTER — Other Ambulatory Visit (HOSPITAL_COMMUNITY): Payer: Self-pay

## 2024-05-27 ENCOUNTER — Other Ambulatory Visit (HOSPITAL_COMMUNITY): Payer: Self-pay

## 2024-05-27 ENCOUNTER — Other Ambulatory Visit: Payer: Self-pay

## 2024-05-29 ENCOUNTER — Other Ambulatory Visit (HOSPITAL_COMMUNITY): Payer: Self-pay

## 2024-06-02 ENCOUNTER — Ambulatory Visit (INDEPENDENT_AMBULATORY_CARE_PROVIDER_SITE_OTHER): Payer: Self-pay

## 2024-06-02 DIAGNOSIS — L988 Other specified disorders of the skin and subcutaneous tissue: Secondary | ICD-10-CM

## 2024-06-02 DIAGNOSIS — R238 Other skin changes: Secondary | ICD-10-CM

## 2024-06-02 NOTE — Progress Notes (Signed)
 Botulinum Toxin and Filler Injection Procedure Note  Procedure: Cosmetic botulinum toxin and Filler administration  Pre-operative Diagnosis: Dynamic rhytides and midface volume loss  Post-operative Diagnosis: Same  Complications:  None  Brief history: The patient desires botulinum toxin injection of her crows feet and fillers on lips and lateral cheeks. I discussed with the patient this proposed procedure of botulinum toxin injections, and fillers, which is customized depending on the particular needs of the patient. It is performed on facial rhytids as a temporary correction and facial aging. The alternatives were discussed with the patient. The risks were addressed including bleeding, scarring, infection, damage to deeper structures, asymmetry, and chronic pain, which may occur infrequently after a procedure. The individual's choice to undergo a surgical procedure is based on the comparison of risks to potential benefits. Other risks include unsatisfactory results, brow ptosis, eyelid ptosis, allergic reaction, temporary paralysis, which should go away with time, bruising, blurring disturbances and delayed healing. Botulinum toxin injections do not arrest the aging process or produce permanent tightening of the eyelid.  Operative intervention maybe necessary to maintain the results of a blepharoplasty or botulinum toxin. The patient understands and wishes to proceed.  Procedure: The area was prepped with Vashe and dried with a clean gauze. Using a clean technique, the botulinum toxin was diluted with 1.25 cc of preservative-free normal saline which was slowly injected with an 18 gauge needle in a tuberculin syringes.  A 32 gauge needles were then used to inject the botulinum toxin. This mixture allow for an aliquot of 4 units per 0.1 cc in each injection site.    Subsequently the mixture was injected in the crows feet area with preservation of the temporal branch to the lateral eyebrow as well as  into each lateral canthal area beginning from the lateral orbital rim medial to the zygomaticus major in 3 separate areas. A total of 24 Units of botulinum toxin was used. A small hematoma formed on the left lateral periorbital region, pressure was held for 2 minutes. No other issues.  Then midface area was injected at the lateral malar/cheek region with [facility] XC bilaterally, for a total of one syringe on each side of the face. The technique used was one puncture of each side and serial passes with equal injections using a Juvederm cannula and aspirating in every pass. Volbella XC was then injected on the upper and lower lips, for a total of half a syringe. No major complications were noted. She was instructed explicitly in post-operative care.  Botox LOT:  [id number] EXP:  2025-12-04  J. Tyler PUMA LOT: [id number] EXP: 2025-10-06  J. [name] LOT: [id number] EXP: 2025-12-04

## 2024-06-02 NOTE — Patient Instructions (Signed)
 Activity  As tolerated tomorrow.  Shower tomorrow. No heavy activities  Diet: Regular No restrictions  Wound Care: None needed.  Call Doctor if any unusual problems occur such as pain, excessive Bleeding, unrelieved Nausea/vomiting, Fever &/or chills, Face discoloration or other issues related to your face. When lying down, keep head elevated on 2-3 pillows or back-rest

## 2024-06-11 ENCOUNTER — Telehealth (INDEPENDENT_AMBULATORY_CARE_PROVIDER_SITE_OTHER): Payer: Self-pay

## 2024-06-11 DIAGNOSIS — E559 Vitamin D deficiency, unspecified: Secondary | ICD-10-CM | POA: Diagnosis not present

## 2024-06-11 DIAGNOSIS — Z6824 Body mass index (BMI) 24.0-24.9, adult: Secondary | ICD-10-CM | POA: Diagnosis not present

## 2024-06-11 DIAGNOSIS — L659 Nonscarring hair loss, unspecified: Secondary | ICD-10-CM

## 2024-06-11 DIAGNOSIS — R7303 Prediabetes: Secondary | ICD-10-CM | POA: Diagnosis not present

## 2024-06-11 DIAGNOSIS — E7800 Pure hypercholesterolemia, unspecified: Secondary | ICD-10-CM | POA: Diagnosis not present

## 2024-06-11 NOTE — Telephone Encounter (Signed)
 Patient concerned about hair thinning. We discussed hair thinning treatment strategies. I recommend starting a low dose of minoxidil  0.25 mg daily, which can help stimulate hair follicles to slow hair loss and promote new growth. This medication has good evidence for effectiveness in female hair thinning, though it may take 3 to 6 months to see results. The oral treatment is not FDA approved yet as the topical treatment is. Side effects are rare at this dose but can include dizziness or swelling, so I instructed the patient to let me know if she experience anything unusual. In addition, taking biotin  supplements may support hair strength and improve texture; it's generally safe but we'll keep the dose moderate to avoid any interference with lab tests. Another option to consider is low-level laser therapy, which uses gentle laser light to stimulate hair follicles. It's non-invasive and safe, with minimal side effects, but requires consistent use over time. Combining these treatments offers the best chance to slow hair loss and encourage regrowth. The patient has a good understanding of all the risks and benefits, therapy course and care. We obtained pictures to follow her progress. All questions were answered.     The plan agreed upon includes: Minoxidil  0.25 mg daily, Biotin  Supplements and Red light therapy.  Follow up PRN  Laura Wall Client, MD [facility]

## 2024-06-12 ENCOUNTER — Other Ambulatory Visit (HOSPITAL_COMMUNITY): Payer: Self-pay

## 2024-06-12 MED ORDER — MINOXIDIL 2.5 MG PO TABS
2.5000 mg | ORAL_TABLET | Freq: Every day | ORAL | 3 refills | Status: AC
  Filled 2024-06-12: qty 90, 90d supply, fill #0
  Filled 2024-08-27: qty 90, 90d supply, fill #1
  Filled 2024-09-29: qty 90, 90d supply, fill #2

## 2024-06-17 ENCOUNTER — Ambulatory Visit (INDEPENDENT_AMBULATORY_CARE_PROVIDER_SITE_OTHER): Payer: Self-pay

## 2024-06-17 DIAGNOSIS — L988 Other specified disorders of the skin and subcutaneous tissue: Secondary | ICD-10-CM

## 2024-06-17 NOTE — Progress Notes (Signed)
 Botulinum Toxin and Filler Injection Procedure Note  Procedure: Cosmetic botulinum toxin and Filler administration  Pre-operative Diagnosis: Dynamic rhytides and lip volume loss  Post-operative Diagnosis: Same  Complications:  None  Brief history: The patient desires botulinum toxin injection of her forehead. I discussed with the patient this proposed procedure of botulinum toxin injections, which is customized depending on the particular needs of the patient. It is performed on facial rhytids as a temporary correction. The alternatives were discussed with the patient. The risks were addressed including bleeding, scarring, infection, damage to deeper structures, asymmetry, and chronic pain, which may occur infrequently after a procedure. The individual's choice to undergo a surgical procedure is based on the comparison of risks to potential benefits. Other risks include unsatisfactory results, brow ptosis, eyelid ptosis, allergic reaction, temporary paralysis, which should go away with time, bruising, blurring disturbances and delayed healing. Botulinum toxin injections do not arrest the aging process or produce permanent tightening of the eyelid.  Operative intervention maybe necessary to maintain the results of a blepharoplasty or botulinum toxin. The patient understands and wishes to proceed.  Procedure: The area was prepped with chlorhexidine  and dried with a clean gauze. Using a clean technique, the botulinum toxin was diluted with 1.25 cc of preservative-free normal saline which was slowly injected with an 18 gauge needle in a tuberculin syringes.  A 32 gauge needles were then used to inject the botulinum toxin. This mixture allow for an aliquot of 4 units per 0.1 cc in each injection site.    Subsequently the mixture was injected in the glabellar and forehead area with preservation of the temporal branch to the lateral eyebrow as well as into each lateral canthal area beginning from the lateral  orbital rim medial to the zygomaticus major in 3 separate areas. A total of 32 Units of botulinum toxin was used. The forehead and glabellar area was injected with care to inject intramuscular only while holding pressure on the supratrochlear vessels in each area during each injection on either side of the medial corrugators. The injection proceeded vertically superiorly to the medial 2/3 of the frontalis muscle and superior 2/3 of the lateral frontalis, again with preservation of the frontal branch.  For fillers the area was prepped with chlorhexidine  and dried with a clean gauze The upper and lower lip area were injected. The technique used was serial puncture with equal injections. We used Volbella XC 0.2 cc. No complications were noted. Light pressure was held for 5 minutes. She was instructed explicitly in post-operative care.  Botox LOT:  [id number]  EXP:  2027/02  [name] LOT: [id number] EXP: 2025-12-04

## 2024-06-29 ENCOUNTER — Other Ambulatory Visit (HOSPITAL_COMMUNITY): Payer: Self-pay

## 2024-06-30 ENCOUNTER — Other Ambulatory Visit (HOSPITAL_COMMUNITY): Payer: Self-pay

## 2024-06-30 MED ORDER — MELOXICAM 15 MG PO TABS
15.0000 mg | ORAL_TABLET | ORAL | 2 refills | Status: AC
  Filled 2024-06-30: qty 30, 60d supply, fill #0
  Filled 2024-08-27: qty 30, 60d supply, fill #1
  Filled 2024-10-27: qty 30, 60d supply, fill #2

## 2024-08-03 ENCOUNTER — Other Ambulatory Visit (HOSPITAL_COMMUNITY): Payer: Self-pay

## 2024-08-03 ENCOUNTER — Encounter (INDEPENDENT_AMBULATORY_CARE_PROVIDER_SITE_OTHER): Payer: Self-pay

## 2024-08-03 DIAGNOSIS — Z6825 Body mass index (BMI) 25.0-25.9, adult: Secondary | ICD-10-CM | POA: Diagnosis not present

## 2024-08-03 DIAGNOSIS — Z1231 Encounter for screening mammogram for malignant neoplasm of breast: Secondary | ICD-10-CM | POA: Diagnosis not present

## 2024-08-03 DIAGNOSIS — Z1211 Encounter for screening for malignant neoplasm of colon: Secondary | ICD-10-CM | POA: Diagnosis not present

## 2024-08-03 DIAGNOSIS — Z719 Counseling, unspecified: Secondary | ICD-10-CM

## 2024-08-03 DIAGNOSIS — D649 Anemia, unspecified: Secondary | ICD-10-CM | POA: Diagnosis not present

## 2024-08-03 DIAGNOSIS — Z01419 Encounter for gynecological examination (general) (routine) without abnormal findings: Secondary | ICD-10-CM | POA: Diagnosis not present

## 2024-08-12 DIAGNOSIS — Z6824 Body mass index (BMI) 24.0-24.9, adult: Secondary | ICD-10-CM | POA: Diagnosis not present

## 2024-08-12 DIAGNOSIS — R7303 Prediabetes: Secondary | ICD-10-CM | POA: Diagnosis not present

## 2024-08-12 DIAGNOSIS — E559 Vitamin D deficiency, unspecified: Secondary | ICD-10-CM | POA: Diagnosis not present

## 2024-08-12 DIAGNOSIS — E7800 Pure hypercholesterolemia, unspecified: Secondary | ICD-10-CM | POA: Diagnosis not present

## 2024-08-26 ENCOUNTER — Ambulatory Visit (INDEPENDENT_AMBULATORY_CARE_PROVIDER_SITE_OTHER): Admitting: Plastic Surgery

## 2024-08-26 ENCOUNTER — Other Ambulatory Visit (HOSPITAL_COMMUNITY)
Admission: RE | Admit: 2024-08-26 | Discharge: 2024-08-26 | Disposition: A | Discharge status: Home / Self Care | Source: Ambulatory Visit | Attending: Plastic Surgery | Admitting: Plastic Surgery

## 2024-08-26 DIAGNOSIS — L989 Disorder of the skin and subcutaneous tissue, unspecified: Secondary | ICD-10-CM

## 2024-08-26 DIAGNOSIS — L859 Epidermal thickening, unspecified: Secondary | ICD-10-CM | POA: Diagnosis not present

## 2024-08-26 DIAGNOSIS — L820 Inflamed seborrheic keratosis: Secondary | ICD-10-CM | POA: Diagnosis not present

## 2024-08-26 DIAGNOSIS — L821 Other seborrheic keratosis: Secondary | ICD-10-CM | POA: Diagnosis not present

## 2024-08-26 NOTE — Progress Notes (Signed)
 Procedure Note  Preoperative Dx: changing skin lesion of back left  Postoperative Dx: Same  Procedure: biopsy of changing skin lesion of back left 3 mm  Anesthesia: Lidocaine  1% with 1:100,000 epinephrine   Indication for Procedure: skin lesion  Description of Procedure: Risks and complications were explained to the patient.  Consent was confirmed and the patient understands the risks and benefits.  The potential complications and alternatives were explained and the patient consents.  The patient expressed understanding the option of not having the procedure and the risks of a scar.  Time out was called and all information was confirmed to be correct.    The area was prepped and drapped.  Lidocaine  1% with epinephrine  was injected in the subcutaneous area.  After waiting several minutes for the local to take affect a #15 blade was used to excise the area as a shave biopsy. The laser at 35 was used for hemostasis.  A dressing was applied.  The patient was given instructions on how to care for the area and a follow up appointment.  Romell tolerated the procedure well and there were no complications. The specimen was sent to pathology.

## 2024-08-27 ENCOUNTER — Other Ambulatory Visit (HOSPITAL_COMMUNITY): Payer: Self-pay

## 2024-08-28 ENCOUNTER — Other Ambulatory Visit: Payer: Self-pay

## 2024-08-28 ENCOUNTER — Other Ambulatory Visit (HOSPITAL_COMMUNITY): Payer: Self-pay

## 2024-08-31 ENCOUNTER — Encounter: Payer: Self-pay | Admitting: Radiology

## 2024-09-01 LAB — SURGICAL PATHOLOGY

## 2024-09-10 ENCOUNTER — Other Ambulatory Visit (HOSPITAL_COMMUNITY): Payer: Self-pay

## 2024-09-10 DIAGNOSIS — R42 Dizziness and giddiness: Secondary | ICD-10-CM | POA: Diagnosis not present

## 2024-09-10 DIAGNOSIS — E7800 Pure hypercholesterolemia, unspecified: Secondary | ICD-10-CM | POA: Diagnosis not present

## 2024-09-10 DIAGNOSIS — M19049 Primary osteoarthritis, unspecified hand: Secondary | ICD-10-CM | POA: Diagnosis not present

## 2024-09-10 DIAGNOSIS — R7303 Prediabetes: Secondary | ICD-10-CM | POA: Diagnosis not present

## 2024-09-10 DIAGNOSIS — E559 Vitamin D deficiency, unspecified: Secondary | ICD-10-CM | POA: Diagnosis not present

## 2024-09-10 DIAGNOSIS — Z0000 Encounter for general adult medical examination without abnormal findings: Secondary | ICD-10-CM | POA: Diagnosis not present

## 2024-09-10 MED ORDER — MELOXICAM 15 MG PO TABS
15.0000 mg | ORAL_TABLET | ORAL | 11 refills | Status: AC
  Filled 2024-09-10 – 2024-11-26 (×5): qty 30, 60d supply, fill #0
  Filled 2024-11-27: qty 45, 90d supply, fill #0

## 2024-09-25 ENCOUNTER — Ambulatory Visit (INDEPENDENT_AMBULATORY_CARE_PROVIDER_SITE_OTHER): Payer: Self-pay | Admitting: Plastic Surgery

## 2024-09-25 DIAGNOSIS — Z719 Counseling, unspecified: Secondary | ICD-10-CM

## 2024-09-25 NOTE — Progress Notes (Signed)

## 2024-09-29 ENCOUNTER — Other Ambulatory Visit (HOSPITAL_COMMUNITY): Payer: Self-pay

## 2024-09-29 ENCOUNTER — Other Ambulatory Visit: Payer: Self-pay

## 2024-09-29 DIAGNOSIS — Z719 Counseling, unspecified: Secondary | ICD-10-CM

## 2024-10-01 ENCOUNTER — Other Ambulatory Visit (HOSPITAL_COMMUNITY): Payer: Self-pay

## 2024-10-01 MED ORDER — AMOXICILLIN-POT CLAVULANATE 875-125 MG PO TABS
1.0000 | ORAL_TABLET | Freq: Two times a day (BID) | ORAL | 1 refills | Status: AC
  Filled 2024-10-01: qty 20, 10d supply, fill #0
  Filled 2024-10-27: qty 20, 10d supply, fill #1

## 2024-10-02 ENCOUNTER — Other Ambulatory Visit (HOSPITAL_COMMUNITY): Payer: Self-pay

## 2024-10-14 DIAGNOSIS — E7800 Pure hypercholesterolemia, unspecified: Secondary | ICD-10-CM | POA: Diagnosis not present

## 2024-10-14 DIAGNOSIS — Z6824 Body mass index (BMI) 24.0-24.9, adult: Secondary | ICD-10-CM | POA: Diagnosis not present

## 2024-10-14 DIAGNOSIS — R7303 Prediabetes: Secondary | ICD-10-CM | POA: Diagnosis not present

## 2024-10-14 DIAGNOSIS — K219 Gastro-esophageal reflux disease without esophagitis: Secondary | ICD-10-CM | POA: Diagnosis not present

## 2024-10-14 DIAGNOSIS — E559 Vitamin D deficiency, unspecified: Secondary | ICD-10-CM | POA: Diagnosis not present

## 2024-10-27 ENCOUNTER — Other Ambulatory Visit: Payer: Self-pay

## 2024-11-02 ENCOUNTER — Other Ambulatory Visit: Payer: Self-pay

## 2024-11-02 MED ORDER — OXYCODONE HCL 5 MG PO TABS
5.0000 mg | ORAL_TABLET | ORAL | 0 refills | Status: AC | PRN
  Filled 2024-11-02: qty 18, 3d supply, fill #0

## 2024-11-02 MED ORDER — VALACYCLOVIR HCL 1 G PO TABS
1000.0000 mg | ORAL_TABLET | Freq: Two times a day (BID) | ORAL | 0 refills | Status: AC
  Filled 2024-11-02: qty 20, 10d supply, fill #0

## 2024-11-02 MED ORDER — DIAZEPAM 5 MG PO TABS
5.0000 mg | ORAL_TABLET | Freq: Two times a day (BID) | ORAL | 0 refills | Status: AC | PRN
  Filled 2024-11-02: qty 2, 1d supply, fill #0

## 2024-11-02 MED ORDER — AMOXICILLIN-POT CLAVULANATE 875-125 MG PO TABS
1.0000 | ORAL_TABLET | Freq: Two times a day (BID) | ORAL | 0 refills | Status: AC
  Filled 2024-11-02 – 2024-11-04 (×2): qty 20, 10d supply, fill #0

## 2024-11-03 ENCOUNTER — Other Ambulatory Visit: Payer: Self-pay

## 2024-11-03 ENCOUNTER — Other Ambulatory Visit (HOSPITAL_COMMUNITY): Payer: Self-pay

## 2024-11-04 ENCOUNTER — Other Ambulatory Visit (HOSPITAL_COMMUNITY): Payer: Self-pay

## 2024-11-05 ENCOUNTER — Other Ambulatory Visit: Payer: Self-pay

## 2024-11-06 ENCOUNTER — Ambulatory Visit: Payer: Self-pay

## 2024-11-06 DIAGNOSIS — L988 Other specified disorders of the skin and subcutaneous tissue: Secondary | ICD-10-CM

## 2024-11-06 DIAGNOSIS — R238 Other skin changes: Secondary | ICD-10-CM

## 2024-11-06 NOTE — Progress Notes (Signed)
 Preoperative Dx: Facial aging    Postoperative Dx:  same   Procedure: Laser to face (Deep resurfacing TRL)    Anesthesia: Pronox (inhaler)    Description of Procedure:  Risks and complications were explained to the patient. Consent was confirmed and signed. Eye protection was placed. Time out was called and all information was confirmed to be correct. The area  area was prepped with alcohol and wiped dry. A face block was performed with lidocaine  1% with epinephrine  for a total of 23 cc.   The TRL laser was tested first.   The forehead was lasered for a total of at 30 J/cm2. The nose was lasered for a total of at 30 J/cm2  The perioral area was lasered for a total of at 75 J/cm2, adding 25 J/cm2 for mental crease and perioral wrinkles. The right cheek was lasered  for a total of at 30 J/cm2. The left cheek was lasered for a total of at 30 J/cm2. The neck was lasered for a total of at 30 J/cm2. The chest was lasered for a total of at 30 J/cm2.   The patient tolerated the procedure well and there were no complications. We discussed postoperative care. Pain medications, antibiotics and antivirals prescribed. The patient is to follow up in 1 week.   Mileydi Milsap M. Keylen Eckenrode, MD [facility]

## 2024-11-16 DIAGNOSIS — Z719 Counseling, unspecified: Secondary | ICD-10-CM

## 2024-11-26 ENCOUNTER — Other Ambulatory Visit (HOSPITAL_COMMUNITY): Payer: Self-pay

## 2024-11-27 ENCOUNTER — Other Ambulatory Visit: Payer: Self-pay

## 2024-12-03 ENCOUNTER — Ambulatory Visit (INDEPENDENT_AMBULATORY_CARE_PROVIDER_SITE_OTHER): Admitting: Physician Assistant

## 2024-12-03 ENCOUNTER — Other Ambulatory Visit (HOSPITAL_COMMUNITY): Payer: Self-pay

## 2024-12-03 ENCOUNTER — Encounter (INDEPENDENT_AMBULATORY_CARE_PROVIDER_SITE_OTHER): Payer: Self-pay | Admitting: Physician Assistant

## 2024-12-03 VITALS — BP 114/66 | HR 72 | Temp 97.9°F

## 2024-12-03 DIAGNOSIS — B009 Herpesviral infection, unspecified: Secondary | ICD-10-CM

## 2024-12-03 MED ORDER — VALACYCLOVIR HCL 1 G PO TABS
1000.0000 mg | ORAL_TABLET | Freq: Two times a day (BID) | ORAL | 3 refills | Status: AC
  Filled 2024-12-03: qty 20, 10d supply, fill #0

## 2024-12-03 NOTE — Progress Notes (Signed)
 Dear Dr. Regino, Here is my assessment for our mutual patient, Aubriegh Minch. Thank you for allowing me the opportunity to care for your patient. Please do not hesitate to contact me should you have any other questions. Sincerely, Chyrl Cohen PA-C  Otolaryngology Clinic Note Referring provider: Dr. Regino HPI:  Laura Wall is a 64 y.o. female kindly referred by Dr. Regino    63 year old female seen in our office for evaluation of right nose pain.  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  She notes recurrence starting yesterday with crusting scabbing and pain typical of recently diagnosed HSV infection with good response to Valtrex  previously.  She denies any other acute concerns here today.  No fever, no bleeding, no other lesions.  Independent Review of Additional Tests or Records:  None   PMH/Meds/All/SocHx/FamHx/ROS:   Past Medical History:  Diagnosis Date   Cancer (HCC)    basal and squamous   Complication of anesthesia    woke up during some surgeries   Frequency of urination    History of kidney stones    Iron  deficiency anemia    Seasonal allergies    SUI (stress urinary incontinence, female)    Toe fracture, left    3rd toe   Ureteral stone pt states stone embedded in urethral sling   Urgency of urination      Past Surgical History:  Procedure Laterality Date   ABDOMINAL HYSTERECTOMY     BENIGN LEFT BREAST LUMPECTOMY  1985   BREAST LUMPECTOMY WITH RADIOACTIVE SEED LOCALIZATION Right 10/07/2014   Procedure: RIGHT BREAST LUMPECTOMY WITH RADIOACTIVE SEED LOCALIZATION;  Surgeon: Donnice Bury, MD;  Location: [facility];  Service: General;  Laterality: Right;   BREAST RECONSTRUCTION Bilateral 09/2014   BREAST REDUCTION SURGERY Bilateral 10/07/2014   Procedure: BILATERAL MAMMARY REDUCTION  (BREAST);  Surgeon: Estefana Reichert, DO;  Location: [facility];  Service: Plastics;  Laterality: Bilateral;    COLD KNIFE CERVICAL CONIZATION  1982   COLONOSCOPY     COLONOSCOPY WITH PROPOFOL   01/15/2022   CYSTOSCOPY  08/29/2012   Procedure: CYSTOSCOPY;  Surgeon: Ricardo Likens, MD;  Location: [facility];  Service: Urology;  Laterality: N/A;   ECTOPIC PREGNANCY SURGERY  2003   BILATERAL FIMBRIECTOMIES (TUBAL RUPTURE)   EXCISION MELANOMA LESION RIGHT TOE  2009   HOLMIUM LASER APPLICATION  08/29/2012   Procedure: HOLMIUM LASER APPLICATION;  Surgeon: Ricardo Likens, MD;  Location: [facility];  Service: Urology;  Laterality: N/A;  There is no left or right designated because the stone is embedded in the urethral sling   HYSTEROSCOPY W/  NOVASURE ENDOMETRIAL ABLATION  2008   AND URETHRAL SLING PLACEMENT   LIPOSUCTION Bilateral 10/07/2014   Procedure: LIPOSUCTION;  Surgeon: Estefana Reichert, DO;  Location: [facility];  Service: Plastics;  Laterality: Bilateral;   NEUROMA SURGERY  2022   ORIF TOE FRACTURE Left 01/12/2016   Procedure: OPEN REDUCTION INTERNAL FIXATION (ORIF) LEFT THIRD TOE FRACTURE;  Surgeon: Norleen Armor, MD;  Location: [facility];  Service: Orthopedics;  Laterality: Left;   ROBOTIC-ASSISTED TOTAL HYSTERECOTMY W/ LEFT OVARIAN CYSTECTOMY  01-14-2009  DR RIVARD   CHRONIC PELVIC PAIN/ UTERINE FIBROIDS/ ENDOMETRIOSIS/ ANEMIA   SACROPLASTY  2021   TUBAL LIGATION  2003   BILATERAL    Family History  Problem Relation Age of Onset   Cancer Mother        multiple myeloma  Cancer Father 65       lung   Breast cancer Paternal Aunt    Breast cancer Paternal Aunt    Breast cancer Paternal Aunt    Colon cancer Neg Hx    Colon polyps Neg Hx    Esophageal cancer Neg Hx    Rectal cancer Neg Hx    Stomach cancer Neg Hx      Social Connections: Not on file     Current Medications[1]   Physical Exam:   BP 114/66   Pulse 72   Temp 97.9 F (36.6 C)   SpO2 95%   Pertinent Findings  CN II-XII grossly intact Anterior rhinoscopy:  Septum midline; right nare with crusting noted, no bleeding, no suspicious lesions No obviously  neck masses/lymphadenopathy/thyromegaly No respiratory distress or stridor    Seprately Identifiable Procedures:  None  Impression & Plans:  Laura Wall is a 64 y.o. female with the following    HSV right nare.  Typical of previous infections.  These resolve quickly with Valtrex .  Reasonable to treat at this time, she will follow-up immediately if she develops any new or worsening signs or symptoms.  Patient had no further questions or concerns at today's visit.  - f/u PRN   Thank you for allowing me the opportunity to care for your patient. Please do not hesitate to contact me should you have any other questions.  Sincerely, Chyrl Cohen PA-C [facility] Phone: [phone] Fax: [phone]  12/03/2024, 2:13 PM            [1]  Current Outpatient Medications:    amoxicillin -clavulanate (AUGMENTIN ) 875-125 MG tablet, Take 1 tablet by mouth 2 (two) times daily., Disp: 20 tablet, Rfl: 0   diazepam  (VALIUM ) 5 MG tablet, Take 5 mg by mouth as needed for anxiety., Disp: , Rfl:    diazepam  (VALIUM ) 5 MG tablet, Take 1 tablet (5 mg total) by mouth every 12 (twelve) hours as needed for up to 2 doses for sedation (One before laser one at night to sleep)., Disp: 2 tablet, Rfl: 0   famotidine  (PEPCID ) 20 MG tablet, Take 1 tablet (20 mg total) by mouth 2 (two) times daily., Disp: 60 tablet, Rfl: 11   FeFum-FePoly-FA-B Cmp-C-Biot (FOLIVANE-PLUS) CAPS, Take 1 capsule by mouth daily., Disp: 90 capsule, Rfl: 3   fluorometholone  (FML FORTE ) 0.25 % ophthalmic suspension, Place 1 drop into both eyes 4 (four) times daily for 2 weeks then taper to 1 drop 2 times daily for 2 additional weeks., Disp: 10 mL, Rfl: 0   fluorometholone  (FML LIQUIFILM) 0.1 % ophthalmic suspension, Place 1 drop into both eyes 4 (four) times daily for 2 weeks, then 2 times daily for 2 weeks, Disp: 5 mL, Rfl: 0    meclizine  (ANTIVERT ) 12.5 MG tablet, Take 1 tablet (12.5 mg total) by mouth 3 (three) times daily as needed for dizziness., Disp: 30 tablet, Rfl: 0   meloxicam  (MOBIC ) 15 MG tablet, Take 1 tablet (15 mg total) by mouth every other day., Disp: 30 tablet, Rfl: 2   meloxicam  (MOBIC ) 15 MG tablet, Take 1 tablet (15 mg total) by mouth every other day., Disp: 30 tablet, Rfl: 11   methylPREDNISolone  (MEDROL  DOSEPAK) 4 MG TBPK tablet, Take as directed per package, Disp: 21 each, Rfl: 0   metoprolol  tartrate (LOPRESSOR ) 25 MG tablet, Take 1 tablet (25 mg total) by mouth 2 (two) times daily as needed only for palpitations or heart rate grater than 120 bpm, Disp: 180 tablet,  Rfl: 3   minoxidil  (LONITEN ) 2.5 MG tablet, Take 1 tablet (2.5 mg total) by mouth daily., Disp: 90 tablet, Rfl: 3   Semaglutide -Weight Management (WEGOVY ) 1.7 MG/0.75ML SOAJ, Inject 1.7 mg into the skin once a week., Disp: 3 mL, Rfl: 1   sertraline  (ZOLOFT ) 25 MG tablet, Take 1 tablet (25 mg total) by mouth daily., Disp: 30 tablet, Rfl: 5   valACYclovir  (VALTREX ) 1000 MG tablet, Take 1 g by mouth as needed., Disp: , Rfl:
